# Patient Record
Sex: Male | Born: 1955 | Race: White | Hispanic: No | Marital: Married
Health system: Southern US, Community
[De-identification: ages and names within clinical notes are randomized; demographics above are authoritative.]

## PROBLEM LIST (undated history)

## (undated) DIAGNOSIS — R6520 Severe sepsis without septic shock: Secondary | ICD-10-CM

## (undated) DIAGNOSIS — J690 Pneumonitis due to inhalation of food and vomit: Secondary | ICD-10-CM

## (undated) DIAGNOSIS — J80 Acute respiratory distress syndrome: Secondary | ICD-10-CM

## (undated) DIAGNOSIS — J9621 Acute and chronic respiratory failure with hypoxia: Secondary | ICD-10-CM

## (undated) DIAGNOSIS — U071 COVID-19: Secondary | ICD-10-CM

## (undated) DIAGNOSIS — A419 Sepsis, unspecified organism: Secondary | ICD-10-CM

---

## 2000-05-02 ENCOUNTER — Encounter: Admission: RE | Admit: 2000-05-02 | Discharge: 2000-05-02 | Payer: Self-pay | Admitting: Family Medicine

## 2000-05-02 ENCOUNTER — Encounter: Payer: Self-pay | Admitting: Family Medicine

## 2000-05-23 ENCOUNTER — Encounter: Payer: Self-pay | Admitting: Family Medicine

## 2000-05-23 ENCOUNTER — Encounter: Admission: RE | Admit: 2000-05-23 | Discharge: 2000-05-23 | Payer: Self-pay | Admitting: Family Medicine

## 2011-09-04 ENCOUNTER — Emergency Department: Payer: Self-pay | Admitting: *Deleted

## 2019-12-13 ENCOUNTER — Other Ambulatory Visit (HOSPITAL_COMMUNITY): Payer: Medicare Other

## 2019-12-13 ENCOUNTER — Inpatient Hospital Stay
Admission: RE | Admit: 2019-12-13 | Discharge: 2020-01-03 | Disposition: A | Discharge status: Rehabilitation Facility | Payer: Medicare Other | Attending: Internal Medicine | Admitting: Internal Medicine

## 2019-12-13 DIAGNOSIS — J9621 Acute and chronic respiratory failure with hypoxia: Secondary | ICD-10-CM | POA: Diagnosis present

## 2019-12-13 DIAGNOSIS — J80 Acute respiratory distress syndrome: Secondary | ICD-10-CM | POA: Diagnosis present

## 2019-12-13 DIAGNOSIS — R6520 Severe sepsis without septic shock: Secondary | ICD-10-CM | POA: Diagnosis present

## 2019-12-13 DIAGNOSIS — Z931 Gastrostomy status: Secondary | ICD-10-CM

## 2019-12-13 DIAGNOSIS — J690 Pneumonitis due to inhalation of food and vomit: Secondary | ICD-10-CM | POA: Diagnosis present

## 2019-12-13 DIAGNOSIS — U071 COVID-19: Secondary | ICD-10-CM | POA: Diagnosis present

## 2019-12-13 DIAGNOSIS — A419 Sepsis, unspecified organism: Secondary | ICD-10-CM | POA: Diagnosis present

## 2019-12-13 DIAGNOSIS — J9690 Respiratory failure, unspecified, unspecified whether with hypoxia or hypercapnia: Secondary | ICD-10-CM

## 2019-12-13 HISTORY — DX: COVID-19: U07.1

## 2019-12-13 HISTORY — DX: Sepsis, unspecified organism: A41.9

## 2019-12-13 HISTORY — DX: Pneumonitis due to inhalation of food and vomit: J69.0

## 2019-12-13 HISTORY — DX: Acute and chronic respiratory failure with hypoxia: J96.21

## 2019-12-13 HISTORY — DX: Severe sepsis without septic shock: R65.20

## 2019-12-13 HISTORY — DX: Acute respiratory distress syndrome: J80

## 2019-12-14 ENCOUNTER — Other Ambulatory Visit (HOSPITAL_COMMUNITY): Payer: Medicare Other

## 2019-12-14 ENCOUNTER — Encounter: Payer: Self-pay | Admitting: Internal Medicine

## 2019-12-14 DIAGNOSIS — U071 COVID-19: Secondary | ICD-10-CM

## 2019-12-14 DIAGNOSIS — A419 Sepsis, unspecified organism: Secondary | ICD-10-CM | POA: Diagnosis not present

## 2019-12-14 DIAGNOSIS — J9621 Acute and chronic respiratory failure with hypoxia: Secondary | ICD-10-CM | POA: Diagnosis not present

## 2019-12-14 DIAGNOSIS — J80 Acute respiratory distress syndrome: Secondary | ICD-10-CM

## 2019-12-14 DIAGNOSIS — J690 Pneumonitis due to inhalation of food and vomit: Secondary | ICD-10-CM

## 2019-12-14 DIAGNOSIS — R6520 Severe sepsis without septic shock: Secondary | ICD-10-CM

## 2019-12-14 LAB — CBC WITH DIFFERENTIAL/PLATELET
Abs Immature Granulocytes: 0.13 10*3/uL — ABNORMAL HIGH (ref 0.00–0.07)
Basophils Absolute: 0 10*3/uL (ref 0.0–0.1)
Basophils Relative: 1 %
Eosinophils Absolute: 0.2 10*3/uL (ref 0.0–0.5)
Eosinophils Relative: 3 %
HCT: 26.3 % — ABNORMAL LOW (ref 39.0–52.0)
Hemoglobin: 8 g/dL — ABNORMAL LOW (ref 13.0–17.0)
Immature Granulocytes: 2 %
Lymphocytes Relative: 28 %
Lymphs Abs: 1.8 10*3/uL (ref 0.7–4.0)
MCH: 27.9 pg (ref 26.0–34.0)
MCHC: 30.4 g/dL (ref 30.0–36.0)
MCV: 91.6 fL (ref 80.0–100.0)
Monocytes Absolute: 0.7 10*3/uL (ref 0.1–1.0)
Monocytes Relative: 10 %
Neutro Abs: 3.7 10*3/uL (ref 1.7–7.7)
Neutrophils Relative %: 56 %
Platelets: 272 10*3/uL (ref 150–400)
RBC: 2.87 MIL/uL — ABNORMAL LOW (ref 4.22–5.81)
RDW: 15.6 % — ABNORMAL HIGH (ref 11.5–15.5)
WBC: 6.5 10*3/uL (ref 4.0–10.5)
nRBC: 0 % (ref 0.0–0.2)

## 2019-12-14 LAB — COMPREHENSIVE METABOLIC PANEL
ALT: 19 U/L (ref 0–44)
AST: 18 U/L (ref 15–41)
Albumin: 2.2 g/dL — ABNORMAL LOW (ref 3.5–5.0)
Alkaline Phosphatase: 61 U/L (ref 38–126)
Anion gap: 12 (ref 5–15)
BUN: 32 mg/dL — ABNORMAL HIGH (ref 8–23)
CO2: 34 mmol/L — ABNORMAL HIGH (ref 22–32)
Calcium: 9 mg/dL (ref 8.9–10.3)
Chloride: 99 mmol/L (ref 98–111)
Creatinine, Ser: 0.57 mg/dL — ABNORMAL LOW (ref 0.61–1.24)
GFR calc Af Amer: >60 mL/min (ref >60)
GFR calc non Af Amer: >60 mL/min (ref >60)
Glucose, Bld: 148 mg/dL — ABNORMAL HIGH (ref 70–99)
Potassium: 3.8 mmol/L (ref 3.5–5.1)
Sodium: 145 mmol/L (ref 135–145)
Total Bilirubin: 0.6 mg/dL (ref 0.3–1.2)
Total Protein: 6.3 g/dL — ABNORMAL LOW (ref 6.5–8.1)

## 2019-12-14 LAB — BLOOD GAS, ARTERIAL
Acid-Base Excess: 14.4 mmol/L — ABNORMAL HIGH (ref 0.0–2.0)
Bicarbonate: 39.7 mmol/L — ABNORMAL HIGH (ref 20.0–28.0)
FIO2: 40
O2 Saturation: 98.7 %
Patient temperature: 36.2
pCO2 arterial: 60.3 mmHg — ABNORMAL HIGH (ref 32.0–48.0)
pH, Arterial: 7.429 (ref 7.350–7.450)
pO2, Arterial: 121 mmHg — ABNORMAL HIGH (ref 83.0–108.0)

## 2019-12-14 LAB — MAGNESIUM: Magnesium: 2.1 mg/dL (ref 1.7–2.4)

## 2019-12-14 LAB — PROTIME-INR
INR: 1.2 (ref 0.8–1.2)
Prothrombin Time: 14.7 seconds (ref 11.4–15.2)

## 2019-12-14 LAB — PHOSPHORUS: Phosphorus: 4 mg/dL (ref 2.5–4.6)

## 2019-12-14 MED ORDER — CHLORHEXIDINE GLUCONATE 0.12 % MT SOLN
5.00 | OROMUCOSAL | Status: DC
Start: 2019-12-13 — End: 2019-12-14

## 2019-12-14 MED ORDER — OXYCODONE HCL 5 MG PO TABS
10.00 | ORAL_TABLET | ORAL | Status: DC
Start: ? — End: 2019-12-14

## 2019-12-14 MED ORDER — SERTRALINE HCL 50 MG PO TABS
50.00 | ORAL_TABLET | ORAL | Status: DC
Start: 2019-12-14 — End: 2019-12-14

## 2019-12-14 MED ORDER — CARVEDILOL 12.5 MG PO TABS
12.50 | ORAL_TABLET | ORAL | Status: DC
Start: 2019-12-13 — End: 2019-12-14

## 2019-12-14 MED ORDER — POTASSIUM & SODIUM PHOSPHATES 280-160-250 MG PO PACK
1.00 | PACK | ORAL | Status: DC
Start: 2019-12-13 — End: 2019-12-14

## 2019-12-14 MED ORDER — CARBOXYMETHYLCELLULOSE SODIUM 0.25 % OP SOLN
1.00 | OPHTHALMIC | Status: DC
Start: 2019-12-14 — End: 2019-12-14

## 2019-12-14 MED ORDER — QUETIAPINE FUMARATE 100 MG PO TABS
100.00 | ORAL_TABLET | ORAL | Status: DC
Start: 2019-12-13 — End: 2019-12-14

## 2019-12-14 MED ORDER — SODIUM CHLORIDE FLUSH 0.9 % IV SOLN
10.00 | INTRAVENOUS | Status: DC
Start: 2019-12-13 — End: 2019-12-14

## 2019-12-14 MED ORDER — POLYETHYLENE GLYCOL 3350 17 GM/SCOOP PO POWD
17.00 | ORAL | Status: DC
Start: 2019-12-14 — End: 2019-12-14

## 2019-12-14 MED ORDER — APIXABAN 2.5 MG PO TABS
2.50 | ORAL_TABLET | ORAL | Status: DC
Start: 2019-12-13 — End: 2019-12-14

## 2019-12-14 MED ORDER — MELATONIN 3 MG PO TABS
3.00 | ORAL_TABLET | ORAL | Status: DC
Start: 2019-12-13 — End: 2019-12-14

## 2019-12-14 MED ORDER — SENNOSIDES 8.6 MG PO TABS
1.00 | ORAL_TABLET | ORAL | Status: DC
Start: ? — End: 2019-12-14

## 2019-12-14 MED ORDER — INSULIN NPH (HUMAN) (ISOPHANE) 100 UNIT/ML ~~LOC~~ SUSP
12.00 | SUBCUTANEOUS | Status: DC
Start: 2019-12-13 — End: 2019-12-14

## 2019-12-14 MED ORDER — ACETAMINOPHEN 325 MG PO TABS
650.00 | ORAL_TABLET | ORAL | Status: DC
Start: ? — End: 2019-12-14

## 2019-12-14 MED ORDER — DEXTROSE 50 % IV SOLN
12.50 | INTRAVENOUS | Status: DC
Start: ? — End: 2019-12-14

## 2019-12-14 MED ORDER — INSULIN LISPRO 100 UNIT/ML ~~LOC~~ SOLN
0.00 | SUBCUTANEOUS | Status: DC
Start: 2019-12-13 — End: 2019-12-14

## 2019-12-14 MED ORDER — ONDANSETRON HCL 4 MG/2ML IJ SOLN
4.00 | INTRAMUSCULAR | Status: DC
Start: ? — End: 2019-12-14

## 2019-12-14 MED ORDER — OXYCODONE HCL 5 MG PO TABS
15.00 | ORAL_TABLET | ORAL | Status: DC
Start: 2019-12-13 — End: 2019-12-14

## 2019-12-14 MED ORDER — ASPIRIN 81 MG PO CHEW
81.00 | CHEWABLE_TABLET | ORAL | Status: DC
Start: 2019-12-14 — End: 2019-12-14

## 2019-12-14 MED ORDER — ALBUTEROL SULFATE (2.5 MG/3ML) 0.083% IN NEBU
2.50 | INHALATION_SOLUTION | RESPIRATORY_TRACT | Status: DC
Start: 2019-12-13 — End: 2019-12-14

## 2019-12-14 MED ORDER — CEFEPIME HCL 2 G IJ SOLR
2.00 | INTRAMUSCULAR | Status: DC
Start: 2019-12-13 — End: 2019-12-14

## 2019-12-14 MED ORDER — AMLODIPINE BESYLATE 10 MG PO TABS
10.00 | ORAL_TABLET | ORAL | Status: DC
Start: 2019-12-14 — End: 2019-12-14

## 2019-12-14 MED ORDER — WH PETROL-MINERAL OIL-LANOLIN 0.1-0.1 % OP OINT
1.00 | TOPICAL_OINTMENT | OPHTHALMIC | Status: DC
Start: ? — End: 2019-12-14

## 2019-12-14 MED ORDER — FAMOTIDINE 20 MG PO TABS
20.00 | ORAL_TABLET | ORAL | Status: DC
Start: 2019-12-13 — End: 2019-12-14

## 2019-12-14 NOTE — Consult Note (Signed)
Pulmonary Critical Care Medicine [facility] [place]  PULMONARY SERVICE  Date of Service: 12/14/2019  PULMONARY CRITICAL CARE Mason Castillo  YBW:[id number]  DOB: 1956-11-10   DOA: 12/13/2019  Referring Physician: Carron Curie, MD  HPI: Mason Castillo is a 63 y.o. male seen for follow up of Acute on Chronic Respiratory Failure.  Patient is multiple medical problems including GERD basal carcinoma hypertension who presented to the hospital with a diagnosis of COVID-19.  Patient was apparently noted to be significantly hypoxic ended up having to be intubated and was started on Decadron as well as remdesivir.  Patient was extubated however failed trial of extubation and had to be reintubated on December 3 because of worsening of mental status.  Patient at that point was felt to have healthcare associated pneumonia versus aspiration.  Started on cefepime and vancomycin and ultimately was titrated down to Unasyn.  The patient was once again attempted at extubation however failed subsequently ended up having to have a tracheostomy done.  Transferred to our facility for further management and weaning  Review of Systems:  ROS performed and is unremarkable other than noted above.  Past Medical History Past Medical History:  Diagnosis Date  . Acid reflux  . Basal cell carcinoma  . Hypertension 2015  . Skin cancer 2017  had skin cancer on face, cannot remember the type   Past Surgical History Past Surgical History:  Procedure Laterality Date  . HERNIA REPAIR  . 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 SKIN BIOPSY  . TONSILLECTOMY   Family History Family History  Problem Relation Age of Onset  . Anesthesia problems Neg Hx  . Bleeding Disorder  Neg Hx  . Melanoma Neg Hx  . Basal cell carcinoma Neg Hx  . Squamous cell carcinoma Neg Hx   Social History Social History   Socioeconomic History  . Marital status: Married  Spouse name: Not on file  . Number of children: Not on file  . Years of education: Not on file  . Highest education level: Not on file  Occupational History  . Not on file  Social Needs  . Financial resource strain: Not on file  . Food insecurity  Worry: Not on file  Inability: Not on file  . Transportation needs  Medical: Not on file  Non-medical: Not on file  Tobacco Use  . Smoking status: Never Smoker  . Smokeless tobacco: Never Used  Substance and Sexual Activity  . Alcohol use: Yes  Comment: occasionally  . Drug use: No    Medications: Reviewed on Rounds  Physical Exam:  Vitals: Temperature 97.0 pulse 86 respiratory 23 blood pressure 141/78 saturations 100%  Ventilator Settings mode ventilation assist control FiO2 40% tidal volume 450 PEEP 5  . General: Comfortable at this time . Eyes: Grossly normal lids, irises & conjunctiva . ENT: grossly tongue is normal . Neck: no obvious mass . Cardiovascular: S1-S2 normal no gallop or rub . Respiratory: No rhonchi no rales are noted at this time . Abdomen: Soft and nontender . Skin: no rash seen on limited exam . Musculoskeletal: not rigid . Psychiatric:unable to assess . Neurologic: no seizure no involuntary movements         Labs on Admission:  Basic Metabolic Panel: Recent Labs  Lab 12/14/19 0638  NA 145  K  3.8  CL 99  CO2 34*  GLUCOSE 148*  BUN 32*  CREATININE 0.57*  CALCIUM 9.0  MG 2.1  PHOS 4.0    Recent Labs  Lab 12/14/19 0228  PHART 7.429  PCO2ART 60.3*  PO2ART 121*  HCO3 39.7*  O2SAT 98.7    Liver Function Tests: Recent Labs  Lab 12/14/19 0638  AST 18  ALT 19  ALKPHOS 61  BILITOT 0.6  PROT 6.3*  ALBUMIN 2.2*   No results for input(s): LIPASE, AMYLASE in the last 168 hours. No results for input(s):  AMMONIA in the last 168 hours.  CBC: Recent Labs  Lab 12/14/19 0638  WBC 6.5  NEUTROABS 3.7  HGB 8.0*  HCT 26.3*  MCV 91.6  PLT 272    Cardiac Enzymes: No results for input(s): CKTOTAL, CKMB, CKMBINDEX, TROPONINI in the last 168 hours.  BNP (last 3 results) No results for input(s): BNP in the last 8760 hours.  ProBNP (last 3 results) No results for input(s): PROBNP in the last 8760 hours.   Radiological Exams on Admission: DG Abd 1 View  Result Date: 12/13/2019 CLINICAL DATA:  Peg tube placement EXAM: ABDOMEN - 1 VIEW COMPARISON:  None. FINDINGS: Injection of contrast through the pre-existing PEG tube opacifies the stomach. There is no definite pneumoperitoneum. Oral contrast is noted throughout the colon. IMPRESSION: Injection of contrast through the pre-existing PEG tube opacifies the stomach. Electronically Signed   By: Constance Holster M.D.   On: 12/13/2019 19:28   DG Chest Port 1 View  Result Date: 12/14/2019 CLINICAL DATA:  Tracheostomy tube EXAM: PORTABLE CHEST 1 VIEW COMPARISON:  07/20/2015 FINDINGS: Tracheostomy tube is seated with cuff mildly distending the trachea. There is a right upper extremity PICC with tip at the upper right atrium. Coarse interstitial opacities which may be related to history of COVID. Lung volumes are low. No definite edema and no visible pneumothorax or pleural effusion. Cardiomegaly. IMPRESSION: 1. Low volume chest with nonspecific interstitial coarsening. 2. Hardware as described. Electronically Signed   By: Monte Fantasia M.D.   On: 12/14/2019 07:01    Assessment/Plan Active Problems:   Acute on chronic respiratory failure with hypoxia (HCC)   COVID-19 virus infection   Aspiration pneumonia due to gastric secretions (HCC)   Severe sepsis (HCC)   Acute respiratory distress syndrome (ARDS) due to 2019 novel coronavirus ([place])   1. Acute on chronic respiratory failure with hypoxia patient right now is on full support on assist control  mode has been on 40% FiO2 tidal volume of 450 PEEP is right now at 5 respiratory therapy will check the RSB I mechanics last chest x-ray showing nonspecific interstitial changing likely related to underlying history of Covid pneumonia. 2. COVID-19 virus infection now is in resolution phase we will continue with supportive care has completed remdesivir Decadron therapy 3. Aspiration pneumonia patient was treated with antibiotics cefepime and also supposed to continue until the end of the month December 15, 2019 for ventilator associated pneumonia. 4. Severe sepsis with shock treated hemodynamics are stable we will continue with supportive care 5. ARDS secondary to COVID-19 plan is to continue with supportive care and monitor radiologically clinically is improved  I have personally seen and evaluated the patient, evaluated laboratory and imaging results, formulated the assessment and plan and placed orders. The Patient requires high complexity decision making for assessment and support.  Case was discussed on Rounds with the Respiratory Therapy Staff Time Spent 79minutes  Allyne Gee, MD [facility] Pulmonary Critical  Care Medicine Sleep Medicine

## 2019-12-15 DIAGNOSIS — U071 COVID-19: Secondary | ICD-10-CM | POA: Diagnosis not present

## 2019-12-15 DIAGNOSIS — J690 Pneumonitis due to inhalation of food and vomit: Secondary | ICD-10-CM | POA: Diagnosis not present

## 2019-12-15 DIAGNOSIS — J9621 Acute and chronic respiratory failure with hypoxia: Secondary | ICD-10-CM | POA: Diagnosis not present

## 2019-12-15 DIAGNOSIS — A419 Sepsis, unspecified organism: Secondary | ICD-10-CM | POA: Diagnosis not present

## 2019-12-15 NOTE — Progress Notes (Addendum)
Pulmonary Critical Care Medicine [place]   PULMONARY CRITICAL CARE SERVICE  PROGRESS NOTE  Date of Service: 12/15/2019  Mason Castillo  KGM:[id number]  DOB: 1956-09-19   DOA: 12/13/2019  Referring Physician: Merton Border, MD  HPI: Mason Castillo is a 63 y.o. male seen for follow up of Acute on Chronic Respiratory Failure.  Patient is on pressure support mode is actually tolerating it quite well right now has been on 28% FiO2.  Respiratory therapy reports to me that they've been having more difficulty with placing him back on the ventilator at nighttime and he may actually do better on pressure support mode 24 hours.  Medications: Reviewed on Rounds  Physical Exam:  Vitals: Temperature 97.0 pulse 79 respiratory rate 22 blood pressure 121/67 saturations 98%  Ventilator Settings mode ventilation pressure support FiO2 20% tidal volume 716 pressure support 12 PEEP 5  . General: Comfortable at this time . Eyes: Grossly normal lids, irises & conjunctiva . ENT: grossly tongue is normal . Neck: no obvious mass . Cardiovascular: S1 S2 normal no gallop . Respiratory: No rhonchi coarse breath sounds are noted at this time . Abdomen: soft . Skin: no rash seen on limited exam . Musculoskeletal: not rigid . Psychiatric:unable to assess . Neurologic: no seizure no involuntary movements         Lab Data:   Basic Metabolic Panel: Recent Labs  Lab 12/14/19 0638  NA 145  K 3.8  CL 99  CO2 34*  GLUCOSE 148*  BUN 32*  CREATININE 0.57*  CALCIUM 9.0  MG 2.1  PHOS 4.0    ABG: Recent Labs  Lab 12/14/19 0228  PHART 7.429  PCO2ART 60.3*  PO2ART 121*  HCO3 39.7*  O2SAT 98.7    Liver Function Tests: Recent Labs  Lab 12/14/19 0638  AST 18  ALT 19  ALKPHOS 61  BILITOT 0.6  PROT 6.3*  ALBUMIN 2.2*   No results for input(s): LIPASE, AMYLASE in the last 168 hours. No results for input(s): AMMONIA in the last 168 hours.  CBC: Recent Labs  Lab  12/14/19 0638  WBC 6.5  NEUTROABS 3.7  HGB 8.0*  HCT 26.3*  MCV 91.6  PLT 272    Cardiac Enzymes: No results for input(s): CKTOTAL, CKMB, CKMBINDEX, TROPONINI in the last 168 hours.  BNP (last 3 results) No results for input(s): BNP in the last 8760 hours.  ProBNP (last 3 results) No results for input(s): PROBNP in the last 8760 hours.  Radiological Exams: DG Abd 1 View  Result Date: 12/13/2019 CLINICAL DATA:  Peg tube placement EXAM: ABDOMEN - 1 VIEW COMPARISON:  None. FINDINGS: Injection of contrast through the pre-existing PEG tube opacifies the stomach. There is no definite pneumoperitoneum. Oral contrast is noted throughout the colon. IMPRESSION: Injection of contrast through the pre-existing PEG tube opacifies the stomach. Electronically Signed   By: Constance Holster M.D.   On: 12/13/2019 19:28   DG Chest Port 1 View  Result Date: 12/14/2019 CLINICAL DATA:  Tracheostomy tube EXAM: PORTABLE CHEST 1 VIEW COMPARISON:  07/20/2015 FINDINGS: Tracheostomy tube is seated with cuff mildly distending the trachea. There is a right upper extremity PICC with tip at the upper right atrium. Coarse interstitial opacities which may be related to history of COVID. Lung volumes are low. No definite edema and no visible pneumothorax or pleural effusion. Cardiomegaly. IMPRESSION: 1. Low volume chest with nonspecific interstitial coarsening. 2. Hardware as described. Electronically Signed   By: Neva Seat.D.  On: 12/14/2019 07:01    Assessment/Plan Active Problems:   Acute on chronic respiratory failure with hypoxia (HCC)   COVID-19 virus infection   Aspiration pneumonia due to gastric secretions (HCC)   Severe sepsis (HCC)   Acute respiratory distress syndrome (ARDS) due to 2019 novel coronavirus (HCC)   1. Acute on chronic respiratory failure hypoxia plan is to continue to wean I would advance on pressure support as tolerated for now if patient does well with this then I think we  can advance to T collar weaning. 2. COVID-19 virus infection treated clinically to improve 3. Aspiration pneumonia treated we'll continue with supportive care 4. Severe sepsis hemodynamics are stable we'll continue to monitor 5. ARDS treated improved we'll continue with present management   I have personally seen and evaluated the patient, evaluated laboratory and imaging results, formulated the assessment and plan and placed orders.  Time 35 minutes discussion with primary care team The Patient requires high complexity decision making for assessment and support.  Rounds were done with the Respiratory Therapy Director and Staff therapists and discussed with nursing staff also.  Yevonne Pax, MD [facility] Pulmonary Critical Care Medicine Sleep Medicine

## 2019-12-16 DIAGNOSIS — U071 COVID-19: Secondary | ICD-10-CM | POA: Diagnosis not present

## 2019-12-16 DIAGNOSIS — J690 Pneumonitis due to inhalation of food and vomit: Secondary | ICD-10-CM | POA: Diagnosis not present

## 2019-12-16 DIAGNOSIS — J9621 Acute and chronic respiratory failure with hypoxia: Secondary | ICD-10-CM | POA: Diagnosis not present

## 2019-12-16 DIAGNOSIS — A419 Sepsis, unspecified organism: Secondary | ICD-10-CM | POA: Diagnosis not present

## 2019-12-16 NOTE — Progress Notes (Addendum)
Pulmonary Critical Care Medicine [place]   PULMONARY CRITICAL CARE SERVICE  PROGRESS NOTE  Date of Service: 12/16/2019  Mason Castillo  SFK:[id number]  DOB: 10/17/1956   DOA: 12/13/2019  Referring Physician: Merton Border, MD  HPI: Mason Castillo is a 63 y.o. male seen for follow up of Acute on Chronic Respiratory Failure.  Patient currently is on pressure support mode comfortable right now without distress has been on 28% FiO2 good saturations are noted  Medications: Reviewed on Rounds  Physical Exam:  Vitals: Temperature 97.9 pulse 88 respiratory rate 25 blood pressure is 142/77 saturations 97%  Ventilator Settings mode of ventilation pressure support FiO2 20% tidal volume 600 pressure 12 PEEP 5  . General: Comfortable at this time . Eyes: Grossly normal lids, irises & conjunctiva . ENT: grossly tongue is normal . Neck: no obvious mass . Cardiovascular: S1 S2 normal no gallop . Respiratory: No rhonchi coarse breath sounds are noted at this time . Abdomen: soft . Skin: no rash seen on limited exam . Musculoskeletal: not rigid . Psychiatric:unable to assess . Neurologic: no seizure no involuntary movements         Lab Data:   Basic Metabolic Panel: Recent Labs  Lab 12/14/19 0638  NA 145  K 3.8  CL 99  CO2 34*  GLUCOSE 148*  BUN 32*  CREATININE 0.57*  CALCIUM 9.0  MG 2.1  PHOS 4.0    ABG: Recent Labs  Lab 12/14/19 0228  PHART 7.429  PCO2ART 60.3*  PO2ART 121*  HCO3 39.7*  O2SAT 98.7    Liver Function Tests: Recent Labs  Lab 12/14/19 0638  AST 18  ALT 19  ALKPHOS 61  BILITOT 0.6  PROT 6.3*  ALBUMIN 2.2*   No results for input(s): LIPASE, AMYLASE in the last 168 hours. No results for input(s): AMMONIA in the last 168 hours.  CBC: Recent Labs  Lab 12/14/19 0638  WBC 6.5  NEUTROABS 3.7  HGB 8.0*  HCT 26.3*  MCV 91.6  PLT 272    Cardiac Enzymes: No results for input(s): CKTOTAL, CKMB, CKMBINDEX, TROPONINI in  the last 168 hours.  BNP (last 3 results) No results for input(s): BNP in the last 8760 hours.  ProBNP (last 3 results) No results for input(s): PROBNP in the last 8760 hours.  Radiological Exams: No results found.  Assessment/Plan Active Problems:   Acute on chronic respiratory failure with hypoxia (HCC)   COVID-19 virus infection   Aspiration pneumonia due to gastric secretions (HCC)   Severe sepsis (HCC)   Acute respiratory distress syndrome (ARDS) due to 2019 novel coronavirus ([place])   1. Acute on chronic respiratory failure with hypoxia patient is doing well with pressure support we will continue to wean as tolerated. 2. COVID-19 virus infection patient is in resolution phase 3. ARDS secondary to COVID-19 residual deficits noted we will continue to monitor 4. Severe sepsis resolved hemodynamics are stable 5. Aspiration pneumonia treated we will continue to follow   I have personally seen and evaluated the patient, evaluated laboratory and imaging results, formulated the assessment and plan and placed orders. The Patient requires high complexity decision making for assessment and support.  Rounds were done with the Respiratory Therapy Director and Staff therapists and discussed with nursing staff also.  Time 35 minutes  Allyne Gee, MD [facility] Pulmonary Critical Care Medicine Sleep Medicine

## 2019-12-17 DIAGNOSIS — J690 Pneumonitis due to inhalation of food and vomit: Secondary | ICD-10-CM | POA: Diagnosis not present

## 2019-12-17 DIAGNOSIS — A419 Sepsis, unspecified organism: Secondary | ICD-10-CM | POA: Diagnosis not present

## 2019-12-17 DIAGNOSIS — J9621 Acute and chronic respiratory failure with hypoxia: Secondary | ICD-10-CM | POA: Diagnosis not present

## 2019-12-17 DIAGNOSIS — U071 COVID-19: Secondary | ICD-10-CM | POA: Diagnosis not present

## 2019-12-17 NOTE — Progress Notes (Signed)
Pulmonary Critical Care Medicine [facility] [place]   PULMONARY CRITICAL CARE SERVICE  PROGRESS NOTE  Date of Service: 12/17/2019  Mason Castillo  [id number]:[id number]  DOB: 1956-05-06   DOA: 12/13/2019  Referring Physician: Carron Curie, MD  HPI: Mason Castillo is a 64 y.o. male seen for follow up of Acute on Chronic Respiratory Failure.  Patient has been on the ventilator on pressure support mode currently on 12/5 and has been requiring 28% FiO2.  Reportedly has been tolerating this quite well and we should be able to move to spontaneous off the ventilator trials  Medications: Reviewed on Rounds  Physical Exam:  Vitals: Temperature is 97.4 pulse 65 respiratory rate 16 blood pressure is 125/68 saturations 99%  Ventilator Settings mode ventilation pressure support FiO2 28% pressure support 12 PEEP 5 tidal volume is 771  . General: Comfortable at this time . Eyes: Grossly normal lids, irises & conjunctiva . ENT: grossly tongue is normal . Neck: no obvious mass . Cardiovascular: S1 S2 normal no gallop . Respiratory: Coarse breath sounds few scattered rhonchi are noted at this time . Abdomen: soft . Skin: no rash seen on limited exam . Musculoskeletal: not rigid . Psychiatric:unable to assess . Neurologic: no seizure no involuntary movements         Lab Data:   Basic Metabolic Panel: Recent Labs  Lab 12/14/19 0638  NA 145  K 3.8  CL 99  CO2 34*  GLUCOSE 148*  BUN 32*  CREATININE 0.57*  CALCIUM 9.0  MG 2.1  PHOS 4.0    ABG: Recent Labs  Lab 12/14/19 0228  PHART 7.429  PCO2ART 60.3*  PO2ART 121*  HCO3 39.7*  O2SAT 98.7    Liver Function Tests: Recent Labs  Lab 12/14/19 0638  AST 18  ALT 19  ALKPHOS 61  BILITOT 0.6  PROT 6.3*  ALBUMIN 2.2*   No results for input(s): LIPASE, AMYLASE in the last 168 hours. No results for input(s): AMMONIA in the last 168 hours.  CBC: Recent Labs  Lab 12/14/19 0638  WBC 6.5  NEUTROABS 3.7  HGB 8.0*   HCT 26.3*  MCV 91.6  PLT 272    Cardiac Enzymes: No results for input(s): CKTOTAL, CKMB, CKMBINDEX, TROPONINI in the last 168 hours.  BNP (last 3 results) No results for input(s): BNP in the last 8760 hours.  ProBNP (last 3 results) No results for input(s): PROBNP in the last 8760 hours.  Radiological Exams: No results found.  Assessment/Plan Active Problems:   Acute on chronic respiratory failure with hypoxia (HCC)   COVID-19 virus infection   Aspiration pneumonia due to gastric secretions (HCC)   Severe sepsis (HCC)   Acute respiratory distress syndrome (ARDS) due to 2019 novel coronavirus (HCC)   1. Acute on chronic respiratory failure with hypoxia patient will be moved over to an NAG device for spontaneous breathing trials.  Patient is currently on 28% FiO2 has excellent tidal volumes noted on the ventilator.  We will start at 2 hours and advance as tolerated 2. COVID-19 virus infection treated improving clinically although slowly 3. Aspiration pneumonia risk we will continue to follow chest x-ray closely 4. Severe sepsis hemodynamics are stable 5. Acute respiratory distress clinically resolved with residual   I have personally seen and evaluated the patient, evaluated laboratory and imaging results, formulated the assessment and plan and placed orders. The Patient requires high complexity decision making for assessment and support.  Rounds were done with the Respiratory Therapy Director and  Staff therapists and discussed with nursing staff also.  Allyne Gee, MD [facility] Pulmonary Critical Care Medicine Sleep Medicine

## 2019-12-18 DIAGNOSIS — J690 Pneumonitis due to inhalation of food and vomit: Secondary | ICD-10-CM | POA: Diagnosis not present

## 2019-12-18 DIAGNOSIS — J9621 Acute and chronic respiratory failure with hypoxia: Secondary | ICD-10-CM | POA: Diagnosis not present

## 2019-12-18 DIAGNOSIS — A419 Sepsis, unspecified organism: Secondary | ICD-10-CM | POA: Diagnosis not present

## 2019-12-18 DIAGNOSIS — U071 COVID-19: Secondary | ICD-10-CM | POA: Diagnosis not present

## 2019-12-18 LAB — BASIC METABOLIC PANEL
Anion gap: 9 (ref 5–15)
BUN: 27 mg/dL — ABNORMAL HIGH (ref 8–23)
CO2: 32 mmol/L (ref 22–32)
Calcium: 8.7 mg/dL — ABNORMAL LOW (ref 8.9–10.3)
Chloride: 101 mmol/L (ref 98–111)
Creatinine, Ser: 0.6 mg/dL — ABNORMAL LOW (ref 0.61–1.24)
GFR calc Af Amer: >60 mL/min (ref >60)
GFR calc non Af Amer: >60 mL/min (ref >60)
Glucose, Bld: 151 mg/dL — ABNORMAL HIGH (ref 70–99)
Potassium: 4.4 mmol/L (ref 3.5–5.1)
Sodium: 142 mmol/L (ref 135–145)

## 2019-12-18 LAB — CBC
HCT: 28.9 % — ABNORMAL LOW (ref 39.0–52.0)
Hemoglobin: 9.3 g/dL — ABNORMAL LOW (ref 13.0–17.0)
MCH: 28.3 pg (ref 26.0–34.0)
MCHC: 32.2 g/dL (ref 30.0–36.0)
MCV: 87.8 fL (ref 80.0–100.0)
Platelets: 234 10*3/uL (ref 150–400)
RBC: 3.29 MIL/uL — ABNORMAL LOW (ref 4.22–5.81)
RDW: 15.9 % — ABNORMAL HIGH (ref 11.5–15.5)
WBC: 6.6 10*3/uL (ref 4.0–10.5)
nRBC: 0 % (ref 0.0–0.2)

## 2019-12-18 NOTE — Progress Notes (Addendum)
Pulmonary Critical Care Medicine [facility] [place]   PULMONARY CRITICAL CARE SERVICE  PROGRESS NOTE  Date of Service: 12/18/2019  Mason Castillo  OIZ:[id number]  DOB: [birth year]   DOA: 12/13/2019  Referring Physician: Carron Curie, MD  HPI: Mason Castillo is a 64 y.o. male seen for follow up of Acute on Chronic Respiratory Failure.  Patient currently is on pressure support mode was able to do 4 hours weaning off the ventilator with  NAG device.  Patient actually tolerated fairly well.  Now is back on the pressure support and is on 28% FiO2  Medications: Reviewed on Rounds  Physical Exam:  Vitals: Temperature 96.9 pulse 50 respiratory rate 13 blood pressure is 147/76 saturations 100%  Ventilator Settings mode ventilation pressure support FiO2 28% tidal volume 571 pressure poor 12 PEEP 5  . General: Comfortable at this time . Eyes: Grossly normal lids, irises & conjunctiva . ENT: grossly tongue is normal . Neck: no obvious mass . Cardiovascular: S1 S2 normal no gallop . Respiratory: No rhonchi no rales are noted at this time . Abdomen: soft . Skin: no rash seen on limited exam . Musculoskeletal: not rigid . Psychiatric:unable to assess . Neurologic: no seizure no involuntary movements         Lab Data:   Basic Metabolic Panel: Recent Labs  Lab 12/14/19 0638 12/18/19 0405  NA 145 142  K 3.8 4.4  CL 99 101  CO2 34* 32  GLUCOSE 148* 151*  BUN 32* 27*  CREATININE 0.57* 0.60*  CALCIUM 9.0 8.7*  MG 2.1  --   PHOS 4.0  --     ABG: Recent Labs  Lab 12/14/19 0228  PHART 7.429  PCO2ART 60.3*  PO2ART 121*  HCO3 39.7*  O2SAT 98.7    Liver Function Tests: Recent Labs  Lab 12/14/19 0638  AST 18  ALT 19  ALKPHOS 61  BILITOT 0.6  PROT 6.3*  ALBUMIN 2.2*   No results for input(s): LIPASE, AMYLASE in the last 168 hours. No results for input(s): AMMONIA in the last 168 hours.  CBC: Recent Labs  Lab 12/14/19 0638 12/18/19 0405  WBC 6.5 6.6   NEUTROABS 3.7  --   HGB 8.0* 9.3*  HCT 26.3* 28.9*  MCV 91.6 87.8  PLT 272 234    Cardiac Enzymes: No results for input(s): CKTOTAL, CKMB, CKMBINDEX, TROPONINI in the last 168 hours.  BNP (last 3 results) No results for input(s): BNP in the last 8760 hours.  ProBNP (last 3 results) No results for input(s): PROBNP in the last 8760 hours.  Radiological Exams: No results found.  Assessment/Plan Active Problems:   Acute on chronic respiratory failure with hypoxia (HCC)   COVID-19 virus infection   Aspiration pneumonia due to gastric secretions (HCC)   Severe sepsis (HCC)   Acute respiratory distress syndrome (ARDS) due to 2019 novel coronavirus (HCC)   1. Acute on chronic respiratory failure with hypoxia we will continue with full support on pressure support at this time patient right now is resting mode patient will be resumed on NAG device again did well this morning with 4 hours. 2. COVID-19 virus infection in recovery phase plan is to continue with supportive care 3. Aspiration pneumonia treated still at risk for aspiration we will continue to follow 4. Severe sepsis resolved hemodynamics are stable 5. ARDS treated patient has had slow improvement last chest x-ray still showed significant interstitial disease as a remnant of the COVID-19   I have personally seen  and evaluated the patient, evaluated laboratory and imaging results, formulated the assessment and plan and placed orders. The Patient requires high complexity decision making with multiple systems involvement.  Rounds were done with the Respiratory Therapy Director and Staff therapists and discussed with nursing staff also.  Time 35 minutes  Allyne Gee, MD [facility] Pulmonary Critical Care Medicine Sleep Medicine

## 2019-12-19 DIAGNOSIS — A419 Sepsis, unspecified organism: Secondary | ICD-10-CM | POA: Diagnosis not present

## 2019-12-19 DIAGNOSIS — U071 COVID-19: Secondary | ICD-10-CM | POA: Diagnosis not present

## 2019-12-19 DIAGNOSIS — J9621 Acute and chronic respiratory failure with hypoxia: Secondary | ICD-10-CM | POA: Diagnosis not present

## 2019-12-19 DIAGNOSIS — J690 Pneumonitis due to inhalation of food and vomit: Secondary | ICD-10-CM | POA: Diagnosis not present

## 2019-12-19 NOTE — Progress Notes (Signed)
Pulmonary Critical Care Medicine [facility] [place]   PULMONARY CRITICAL CARE SERVICE  PROGRESS NOTE  Date of Service: 12/19/2019  Mason Castillo  [id number]:[id number]  DOB: 1956-04-09   DOA: 12/13/2019  Referring Physician: Carron Curie, MD  HPI: Mason Castillo is a 64 y.o. male seen for follow up of Acute on Chronic Respiratory Failure.  Patient is on the NAG device right now on 1 L oxygen the goal is for 8 hours  Medications: Reviewed on Rounds  Physical Exam:  Vitals: Temperature 97.2 pulse 67 respiratory 18 blood pressure is 109/51 saturations 99%  Ventilator Settings off the ventilator on 1 L oxygen  . General: Comfortable at this time . Eyes: Grossly normal lids, irises & conjunctiva . ENT: grossly tongue is normal . Neck: no obvious mass . Cardiovascular: S1 S2 normal no gallop . Respiratory: Scattered rhonchi expansion is equal . Abdomen: soft . Skin: no rash seen on limited exam . Musculoskeletal: not rigid . Psychiatric:unable to assess . Neurologic: no seizure no involuntary movements         Lab Data:   Basic Metabolic Panel: Recent Labs  Lab 12/14/19 0638 12/18/19 0405  NA 145 142  K 3.8 4.4  CL 99 101  CO2 34* 32  GLUCOSE 148* 151*  BUN 32* 27*  CREATININE 0.57* 0.60*  CALCIUM 9.0 8.7*  MG 2.1  --   PHOS 4.0  --     ABG: Recent Labs  Lab 12/14/19 0228  PHART 7.429  PCO2ART 60.3*  PO2ART 121*  HCO3 39.7*  O2SAT 98.7    Liver Function Tests: Recent Labs  Lab 12/14/19 0638  AST 18  ALT 19  ALKPHOS 61  BILITOT 0.6  PROT 6.3*  ALBUMIN 2.2*   No results for input(s): LIPASE, AMYLASE in the last 168 hours. No results for input(s): AMMONIA in the last 168 hours.  CBC: Recent Labs  Lab 12/14/19 0638 12/18/19 0405  WBC 6.5 6.6  NEUTROABS 3.7  --   HGB 8.0* 9.3*  HCT 26.3* 28.9*  MCV 91.6 87.8  PLT 272 234    Cardiac Enzymes: No results for input(s): CKTOTAL, CKMB, CKMBINDEX, TROPONINI in the last 168 hours.  BNP  (last 3 results) No results for input(s): BNP in the last 8760 hours.  ProBNP (last 3 results) No results for input(s): PROBNP in the last 8760 hours.  Radiological Exams: No results found.  Assessment/Plan Active Problems:   Acute on chronic respiratory failure with hypoxia (HCC)   COVID-19 virus infection   Aspiration pneumonia due to gastric secretions (HCC)   Severe sepsis (HCC)   Acute respiratory distress syndrome (ARDS) due to 2019 novel coronavirus (HCC)   1. Acute on chronic respiratory failure with hypoxia plan is to continue with weaning off the ventilator right now is requiring 1 L oxygen 2. COVID-19 virus infection resolved 3. Aspiration pneumonia treated we will continue to follow 4. ARDS secondary to COVID-19 patient is going to be continue to follow still has some residual deficits 5. Severe sepsis hemodynamics are stable   I have personally seen and evaluated the patient, evaluated laboratory and imaging results, formulated the assessment and plan and placed orders. The Patient requires high complexity decision making with multiple systems involvement.  Rounds were done with the Respiratory Therapy Director and Staff therapists and discussed with nursing staff also.  Yevonne Pax, MD [facility] Pulmonary Critical Care Medicine Sleep Medicine

## 2019-12-20 DIAGNOSIS — U071 COVID-19: Secondary | ICD-10-CM | POA: Diagnosis not present

## 2019-12-20 DIAGNOSIS — J690 Pneumonitis due to inhalation of food and vomit: Secondary | ICD-10-CM | POA: Diagnosis not present

## 2019-12-20 DIAGNOSIS — J9621 Acute and chronic respiratory failure with hypoxia: Secondary | ICD-10-CM | POA: Diagnosis not present

## 2019-12-20 DIAGNOSIS — A419 Sepsis, unspecified organism: Secondary | ICD-10-CM | POA: Diagnosis not present

## 2019-12-20 NOTE — Progress Notes (Signed)
Pulmonary Critical Care Medicine [facility] [place]   PULMONARY CRITICAL CARE SERVICE  PROGRESS NOTE  Date of Service: 12/20/2019  Mason Castillo  [id number]  DOB: 1956/04/14   DOA: 12/13/2019  Referring Physician: Carron Curie, MD  HPI: Mason Castillo is a 64 y.o. male seen for follow up of Acute on Chronic Respiratory Failure.  Patient currently is on the NAG device patient has been on 1 L oxygen the goal is for 12 hours today  Medications: Reviewed on Rounds  Physical Exam:  Vitals: Temperature 97.5 pulse 62 respiratory rate 15 blood pressure is 126/77 saturations 98%  Ventilator Settings off the ventilator right now on NAG device  . General: Comfortable at this time . Eyes: Grossly normal lids, irises & conjunctiva . ENT: grossly tongue is normal . Neck: no obvious mass . Cardiovascular: S1 S2 normal no gallop . Respiratory: No rhonchi no rales noted at this time . Abdomen: soft . Skin: no rash seen on limited exam . Musculoskeletal: not rigid . Psychiatric:unable to assess . Neurologic: no seizure no involuntary movements         Lab Data:   Basic Metabolic Panel: Recent Labs  Lab 12/14/19 0638 12/18/19 0405  NA 145 142  K 3.8 4.4  CL 99 101  CO2 34* 32  GLUCOSE 148* 151*  BUN 32* 27*  CREATININE 0.57* 0.60*  CALCIUM 9.0 8.7*  MG 2.1  --   PHOS 4.0  --     ABG: Recent Labs  Lab 12/14/19 0228  PHART 7.429  PCO2ART 60.3*  PO2ART 121*  HCO3 39.7*  O2SAT 98.7    Liver Function Tests: Recent Labs  Lab 12/14/19 0638  AST 18  ALT 19  ALKPHOS 61  BILITOT 0.6  PROT 6.3*  ALBUMIN 2.2*   No results for input(s): LIPASE, AMYLASE in the last 168 hours. No results for input(s): AMMONIA in the last 168 hours.  CBC: Recent Labs  Lab 12/14/19 0638 12/18/19 0405  WBC 6.5 6.6  NEUTROABS 3.7  --   HGB 8.0* 9.3*  HCT 26.3* 28.9*  MCV 91.6 87.8  PLT 272 234    Cardiac Enzymes: No results for input(s): CKTOTAL, CKMB,  CKMBINDEX, TROPONINI in the last 168 hours.  BNP (last 3 results) No results for input(s): BNP in the last 8760 hours.  ProBNP (last 3 results) No results for input(s): PROBNP in the last 8760 hours.  Radiological Exams: No results found.  Assessment/Plan Active Problems:   Acute on chronic respiratory failure with hypoxia (HCC)   COVID-19 virus infection   Aspiration pneumonia due to gastric secretions (HCC)   Severe sepsis (HCC)   Acute respiratory distress syndrome (ARDS) due to 2019 novel coronavirus (HCC)   1. Acute on chronic respiratory failure hypoxia plan is to continue with the weaning right now the goal is for 12 hours 2. COVID-19 virus infection treated we will continue with supportive care 3. Aspiration pneumonia treated improving slight 4. Severe sepsis patient is at baseline 5. ARDS treated clinically improved we will continue to follow along.   I have personally seen and evaluated the patient, evaluated laboratory and imaging results, formulated the assessment and plan and placed orders. The Patient requires high complexity decision making with multiple systems involvement.  Rounds were done with the Respiratory Therapy Director and Staff therapists and discussed with nursing staff also.  Yevonne Pax, MD [facility] Pulmonary Critical Care Medicine Sleep Medicine

## 2019-12-21 DIAGNOSIS — J690 Pneumonitis due to inhalation of food and vomit: Secondary | ICD-10-CM | POA: Diagnosis not present

## 2019-12-21 DIAGNOSIS — U071 COVID-19: Secondary | ICD-10-CM | POA: Diagnosis not present

## 2019-12-21 DIAGNOSIS — J9621 Acute and chronic respiratory failure with hypoxia: Secondary | ICD-10-CM | POA: Diagnosis not present

## 2019-12-21 DIAGNOSIS — A419 Sepsis, unspecified organism: Secondary | ICD-10-CM | POA: Diagnosis not present

## 2019-12-21 NOTE — Progress Notes (Signed)
Pulmonary Critical Care Medicine [facility] [place]   PULMONARY CRITICAL CARE SERVICE  PROGRESS NOTE  Date of Service: 12/21/2019  Mason Castillo  [id number]  DOB: 1956-12-09   DOA: 12/13/2019  Referring Physician: Carron Curie, MD  HPI: Mason Castillo is a 64 y.o. male seen for follow up of Acute on Chronic Respiratory Failure.  Patient right now is off the ventilator on the NAG device right now is not requiring any oxygen patient's goal is about 20 hours  Medications: Reviewed on Rounds  Physical Exam:  Vitals: Temperature 98.1 pulse 78 respiratory rate 23 blood pressure is 132/66 saturations 98%  Ventilator Settings off the ventilator right now on the NAG  . General: Comfortable at this time . Eyes: Grossly normal lids, irises & conjunctiva . ENT: grossly tongue is normal . Neck: no obvious mass . Cardiovascular: S1 S2 normal no gallop . Respiratory: Scattered rhonchi expansion is equal . Abdomen: soft . Skin: no rash seen on limited exam . Musculoskeletal: not rigid . Psychiatric:unable to assess . Neurologic: no seizure no involuntary movements         Lab Data:   Basic Metabolic Panel: Recent Labs  Lab 12/18/19 0405  NA 142  K 4.4  CL 101  CO2 32  GLUCOSE 151*  BUN 27*  CREATININE 0.60*  CALCIUM 8.7*    ABG: No results for input(s): PHART, PCO2ART, PO2ART, HCO3, O2SAT in the last 168 hours.  Liver Function Tests: No results for input(s): AST, ALT, ALKPHOS, BILITOT, PROT, ALBUMIN in the last 168 hours. No results for input(s): LIPASE, AMYLASE in the last 168 hours. No results for input(s): AMMONIA in the last 168 hours.  CBC: Recent Labs  Lab 12/18/19 0405  WBC 6.6  HGB 9.3*  HCT 28.9*  MCV 87.8  PLT 234    Cardiac Enzymes: No results for input(s): CKTOTAL, CKMB, CKMBINDEX, TROPONINI in the last 168 hours.  BNP (last 3 results) No results for input(s): BNP in the last 8760 hours.  ProBNP (last 3 results) No results for  input(s): PROBNP in the last 8760 hours.  Radiological Exams: No results found.  Assessment/Plan Active Problems:   Acute on chronic respiratory failure with hypoxia (HCC)   COVID-19 virus infection   Aspiration pneumonia due to gastric secretions (HCC)   Severe sepsis (HCC)   Acute respiratory distress syndrome (ARDS) due to 2019 novel coronavirus (HCC)   1. Acute on chronic respiratory failure with hypoxia patient continues to wean doing well the goal is for 20 hours off the ventilator.  If able patient can actually extend to 24 hours 2. COVID-19 virus infection resolved 3. Aspiration pneumonia treated 4. ARDS secondary to COVID-19 treated clinically improved 5. Severe sepsis no change continue with present management   I have personally seen and evaluated the patient, evaluated laboratory and imaging results, formulated the assessment and plan and placed orders. The Patient requires high complexity decision making with multiple systems involvement.  Rounds were done with the Respiratory Therapy Director and Staff therapists and discussed with nursing staff also.  Yevonne Pax, MD [facility] Pulmonary Critical Care Medicine Sleep Medicine

## 2019-12-22 DIAGNOSIS — J9621 Acute and chronic respiratory failure with hypoxia: Secondary | ICD-10-CM | POA: Diagnosis not present

## 2019-12-22 DIAGNOSIS — U071 COVID-19: Secondary | ICD-10-CM | POA: Diagnosis not present

## 2019-12-22 DIAGNOSIS — A419 Sepsis, unspecified organism: Secondary | ICD-10-CM | POA: Diagnosis not present

## 2019-12-22 DIAGNOSIS — J690 Pneumonitis due to inhalation of food and vomit: Secondary | ICD-10-CM | POA: Diagnosis not present

## 2019-12-22 NOTE — Progress Notes (Signed)
Pulmonary Critical Care Medicine [facility] [place]   PULMONARY CRITICAL CARE SERVICE  PROGRESS NOTE  Date of Service: 12/22/2019  Mason Castillo  [id number]  DOB: 03-04-1956   DOA: 12/13/2019  Referring Physician: Carron Curie, MD  HPI: Mason Castillo is a 64 y.o. male seen for follow up of Acute on Chronic Respiratory Failure.  Patient currently is on the NAG device has been on 28% FiO2 the goal today is for 24 hours  Medications: Reviewed on Rounds  Physical Exam:  Vitals: Temperature 97.4 pulse 57 respiratory rate 12 blood pressure is 106/66 saturations 100%  Ventilator Settings off the ventilator right now on 28% FiO2  . General: Comfortable at this time . Eyes: Grossly normal lids, irises & conjunctiva . ENT: grossly tongue is normal . Neck: no obvious mass . Cardiovascular: S1 S2 normal no gallop . Respiratory: No rhonchi coarse breath sounds are noted at this time . Abdomen: soft . Skin: no rash seen on limited exam . Musculoskeletal: not rigid . Psychiatric:unable to assess . Neurologic: no seizure no involuntary movements         Lab Data:   Basic Metabolic Panel: Recent Labs  Lab 12/18/19 0405  NA 142  K 4.4  CL 101  CO2 32  GLUCOSE 151*  BUN 27*  CREATININE 0.60*  CALCIUM 8.7*    ABG: No results for input(s): PHART, PCO2ART, PO2ART, HCO3, O2SAT in the last 168 hours.  Liver Function Tests: No results for input(s): AST, ALT, ALKPHOS, BILITOT, PROT, ALBUMIN in the last 168 hours. No results for input(s): LIPASE, AMYLASE in the last 168 hours. No results for input(s): AMMONIA in the last 168 hours.  CBC: Recent Labs  Lab 12/18/19 0405  WBC 6.6  HGB 9.3*  HCT 28.9*  MCV 87.8  PLT 234    Cardiac Enzymes: No results for input(s): CKTOTAL, CKMB, CKMBINDEX, TROPONINI in the last 168 hours.  BNP (last 3 results) No results for input(s): BNP in the last 8760 hours.  ProBNP (last 3 results) No results for input(s): PROBNP  in the last 8760 hours.  Radiological Exams: No results found.  Assessment/Plan Active Problems:   Acute on chronic respiratory failure with hypoxia (HCC)   COVID-19 virus infection   Aspiration pneumonia due to gastric secretions (HCC)   Severe sepsis (HCC)   Acute respiratory distress syndrome (ARDS) due to 2019 novel coronavirus (HCC)   1. Acute on chronic respiratory failure hypoxia the plan is to continue with the NAG device right now today the goal is 24 hours 2. COVID-19 virus infection at baseline we will continue with supportive care 3. Aspiration pneumonia treated 4. Severe sepsis resolved hemodynamics are stable 5. ARDS treated slow improvement we will continue to monitor radiologically   I have personally seen and evaluated the patient, evaluated laboratory and imaging results, formulated the assessment and plan and placed orders. The Patient requires high complexity decision making with multiple systems involvement.  Rounds were done with the Respiratory Therapy Director and Staff therapists and discussed with nursing staff also.  Yevonne Pax, MD [facility] Pulmonary Critical Care Medicine Sleep Medicine

## 2019-12-23 DIAGNOSIS — U071 COVID-19: Secondary | ICD-10-CM | POA: Diagnosis not present

## 2019-12-23 DIAGNOSIS — J9621 Acute and chronic respiratory failure with hypoxia: Secondary | ICD-10-CM | POA: Diagnosis not present

## 2019-12-23 DIAGNOSIS — J690 Pneumonitis due to inhalation of food and vomit: Secondary | ICD-10-CM | POA: Diagnosis not present

## 2019-12-23 DIAGNOSIS — A419 Sepsis, unspecified organism: Secondary | ICD-10-CM | POA: Diagnosis not present

## 2019-12-23 NOTE — Progress Notes (Signed)
Pulmonary Critical Care Medicine [name]   PULMONARY CRITICAL CARE SERVICE  PROGRESS NOTE  Date of Service: 12/23/2019  Mason Castillo  [id number]  DOB: 1956/06/30   DOA: 12/13/2019  Referring Physician: Carron Curie, MD  HPI: Mason Castillo is a 64 y.o. male seen for follow up of Acute on Chronic Respiratory Failure.  Patient is currently on the NAG device has been on 1 L oxygen and today will be completing 24 hours  Medications: Reviewed on Rounds  Physical Exam:  Vitals: Temperature is 98.2 pulse 57 respiratory rate 20 blood pressure 107/64 saturations 100%  Ventilator Settings off the ventilator on NAG  . General: Comfortable at this time . Eyes: Grossly normal lids, irises & conjunctiva . ENT: grossly tongue is normal . Neck: no obvious mass . Cardiovascular: S1 S2 normal no gallop . Respiratory: No rhonchi no rales are noted at this time . Abdomen: soft . Skin: no rash seen on limited exam . Musculoskeletal: not rigid . Psychiatric:unable to assess . Neurologic: no seizure no involuntary movements         Lab Data:   Basic Metabolic Panel: Recent Labs  Lab 12/18/19 0405  NA 142  K 4.4  CL 101  CO2 32  GLUCOSE 151*  BUN 27*  CREATININE 0.60*  CALCIUM 8.7*    ABG: No results for input(s): PHART, PCO2ART, PO2ART, HCO3, O2SAT in the last 168 hours.  Liver Function Tests: No results for input(s): AST, ALT, ALKPHOS, BILITOT, PROT, ALBUMIN in the last 168 hours. No results for input(s): LIPASE, AMYLASE in the last 168 hours. No results for input(s): AMMONIA in the last 168 hours.  CBC: Recent Labs  Lab 12/18/19 0405  WBC 6.6  HGB 9.3*  HCT 28.9*  MCV 87.8  PLT 234    Cardiac Enzymes: No results for input(s): CKTOTAL, CKMB, CKMBINDEX, TROPONINI in the last 168 hours.  BNP (last 3 results) No results for input(s): BNP in the last 8760 hours.  ProBNP (last 3 results) No results for input(s): PROBNP in the last 8760  hours.  Radiological Exams: No results found.  Assessment/Plan Active Problems:   Acute on chronic respiratory failure with hypoxia (HCC)   COVID-19 virus infection   Aspiration pneumonia due to gastric secretions (HCC)   Severe sepsis (HCC)   Acute respiratory distress syndrome (ARDS) due to 2019 novel coronavirus (HCC)   1. Acute on chronic respiratory failure with hypoxia patient will be continued on weaning more than 24 hours today 2. COVID-19 virus infection resolved 3. Aspiration pneumonia treated 4. Severe sepsis hemodynamics are stable 5. ARDS patient is at baseline we will continue present management   I have personally seen and evaluated the patient, evaluated laboratory and imaging results, formulated the assessment and plan and placed orders. The Patient requires high complexity decision making with multiple systems involvement.  Rounds were done with the Respiratory Therapy Director and Staff therapists and discussed with nursing staff also.  Yevonne Pax, MD [facility] Pulmonary Critical Care Medicine Sleep Medicine

## 2019-12-24 DIAGNOSIS — U071 COVID-19: Secondary | ICD-10-CM | POA: Diagnosis not present

## 2019-12-24 DIAGNOSIS — J690 Pneumonitis due to inhalation of food and vomit: Secondary | ICD-10-CM | POA: Diagnosis not present

## 2019-12-24 DIAGNOSIS — A419 Sepsis, unspecified organism: Secondary | ICD-10-CM | POA: Diagnosis not present

## 2019-12-24 DIAGNOSIS — J9621 Acute and chronic respiratory failure with hypoxia: Secondary | ICD-10-CM | POA: Diagnosis not present

## 2019-12-24 NOTE — Progress Notes (Signed)
Pulmonary Critical Care Medicine [facility] [place]   PULMONARY CRITICAL CARE SERVICE  PROGRESS NOTE  Date of Service: 12/24/2019  Mason Castillo  [id number]  DOB: September 19, 1956   DOA: 12/13/2019  Referring Physician: Carron Curie, MD  HPI: Mason Castillo is a 64 y.o. male seen for follow up of Acute on Chronic Respiratory Failure.  Patient is NAG device has been doing well for 48 hours  Medications: Reviewed on Rounds  Physical Exam:  Vitals: Temperature 98.2 pulse 78 respiratory 19 blood pressure is 108/48 saturations 99%  Ventilator Settings no rhonchi no rales are noted at this time  . General: Comfortable at this time . Eyes: Grossly normal lids, irises & conjunctiva . ENT: grossly tongue is normal . Neck: no obvious mass . Cardiovascular: S1 S2 normal no gallop . Respiratory: Coarse breath sounds few rhonchi . Abdomen: soft . Skin: no rash seen on limited exam . Musculoskeletal: not rigid . Psychiatric:unable to assess . Neurologic: no seizure no involuntary movements         Lab Data:   Basic Metabolic Panel: Recent Labs  Lab 12/18/19 0405  NA 142  K 4.4  CL 101  CO2 32  GLUCOSE 151*  BUN 27*  CREATININE 0.60*  CALCIUM 8.7*    ABG: No results for input(s): PHART, PCO2ART, PO2ART, HCO3, O2SAT in the last 168 hours.  Liver Function Tests: No results for input(s): AST, ALT, ALKPHOS, BILITOT, PROT, ALBUMIN in the last 168 hours. No results for input(s): LIPASE, AMYLASE in the last 168 hours. No results for input(s): AMMONIA in the last 168 hours.  CBC: Recent Labs  Lab 12/18/19 0405  WBC 6.6  HGB 9.3*  HCT 28.9*  MCV 87.8  PLT 234    Cardiac Enzymes: No results for input(s): CKTOTAL, CKMB, CKMBINDEX, TROPONINI in the last 168 hours.  BNP (last 3 results) No results for input(s): BNP in the last 8760 hours.  ProBNP (last 3 results) No results for input(s): PROBNP in the last 8760 hours.  Radiological Exams: No results  found.  Assessment/Plan Active Problems:   Acute on chronic respiratory failure with hypoxia (HCC)   COVID-19 virus infection   Aspiration pneumonia due to gastric secretions (HCC)   Severe sepsis (HCC)   Acute respiratory distress syndrome (ARDS) due to 2019 novel coronavirus (HCC)   1. Acute on chronic respiratory failure hypoxia plan is to continue with weaning advance to capping trials 2. COVID-19 virus infection treated resolving 3. Aspiration pneumonia treated clinically improving 4. Severe sepsis hemodynamics are stable 5. ARDS treated we will continue to follow along   I have personally seen and evaluated the patient, evaluated laboratory and imaging results, formulated the assessment and plan and placed orders. The Patient requires high complexity decision making with multiple systems involvement.  Rounds were done with the Respiratory Therapy Director and Staff therapists and discussed with nursing staff also.  Yevonne Pax, MD [facility] Pulmonary Critical Care Medicine Sleep Medicine

## 2019-12-25 ENCOUNTER — Other Ambulatory Visit (HOSPITAL_COMMUNITY): Payer: Medicare Other

## 2019-12-25 DIAGNOSIS — J9621 Acute and chronic respiratory failure with hypoxia: Secondary | ICD-10-CM | POA: Diagnosis not present

## 2019-12-25 DIAGNOSIS — U071 COVID-19: Secondary | ICD-10-CM | POA: Diagnosis not present

## 2019-12-25 DIAGNOSIS — J690 Pneumonitis due to inhalation of food and vomit: Secondary | ICD-10-CM | POA: Diagnosis not present

## 2019-12-25 DIAGNOSIS — A419 Sepsis, unspecified organism: Secondary | ICD-10-CM | POA: Diagnosis not present

## 2019-12-25 LAB — CBC
HCT: 25.9 % — ABNORMAL LOW (ref 39.0–52.0)
Hemoglobin: 8.4 g/dL — ABNORMAL LOW (ref 13.0–17.0)
MCH: 29.3 pg (ref 26.0–34.0)
MCHC: 32.4 g/dL (ref 30.0–36.0)
MCV: 90.2 fL (ref 80.0–100.0)
Platelets: 233 10*3/uL (ref 150–400)
RBC: 2.87 MIL/uL — ABNORMAL LOW (ref 4.22–5.81)
RDW: 19.1 % — ABNORMAL HIGH (ref 11.5–15.5)
WBC: 5.1 10*3/uL (ref 4.0–10.5)
nRBC: 0 % (ref 0.0–0.2)

## 2019-12-25 LAB — BASIC METABOLIC PANEL
Anion gap: 8 (ref 5–15)
BUN: 17 mg/dL (ref 8–23)
CO2: 33 mmol/L — ABNORMAL HIGH (ref 22–32)
Calcium: 8.6 mg/dL — ABNORMAL LOW (ref 8.9–10.3)
Chloride: 97 mmol/L — ABNORMAL LOW (ref 98–111)
Creatinine, Ser: 0.85 mg/dL (ref 0.61–1.24)
GFR calc Af Amer: >60 mL/min (ref >60)
GFR calc non Af Amer: >60 mL/min (ref >60)
Glucose, Bld: 137 mg/dL — ABNORMAL HIGH (ref 70–99)
Potassium: 4.1 mmol/L (ref 3.5–5.1)
Sodium: 138 mmol/L (ref 135–145)

## 2019-12-25 NOTE — Progress Notes (Addendum)
Pulmonary Critical Care Medicine [place]   PULMONARY CRITICAL CARE SERVICE  PROGRESS NOTE  Date of Service: 12/25/2019  Mason Castillo  [id number]  DOB: [birth year]   DOA: 12/13/2019  Referring Physician: Merton Border, MD  HPI: Mason Castillo is a 64 y.o. male seen for follow up of Acute on Chronic Respiratory Failure.  Patient remains capped at this time for total of 24 hours with current goal of 48 hours.  On 1 L nasal cannula satting well.  Medications: Reviewed on Rounds  Physical Exam:  Vitals: Pulse 106 respirations 29 BP 120/63 O2 sat 99% temp 97.7  Ventilator Settings 1 L nasal cannula  . General: Comfortable at this time . Eyes: Grossly normal lids, irises & conjunctiva . ENT: grossly tongue is normal . Neck: no obvious mass . Cardiovascular: S1 S2 normal no gallop . Respiratory: No rales or rhonchi noted . Abdomen: soft . Skin: no rash seen on limited exam . Musculoskeletal: not rigid . Psychiatric:unable to assess . Neurologic: no seizure no involuntary movements         Lab Data:   Basic Metabolic Panel: Recent Labs  Lab 12/25/19 0627  NA 138  K 4.1  CL 97*  CO2 33*  GLUCOSE 137*  BUN 17  CREATININE 0.85  CALCIUM 8.6*    ABG: No results for input(s): PHART, PCO2ART, PO2ART, HCO3, O2SAT in the last 168 hours.  Liver Function Tests: No results for input(s): AST, ALT, ALKPHOS, BILITOT, PROT, ALBUMIN in the last 168 hours. No results for input(s): LIPASE, AMYLASE in the last 168 hours. No results for input(s): AMMONIA in the last 168 hours.  CBC: Recent Labs  Lab 12/25/19 0627  WBC 5.1  HGB 8.4*  HCT 25.9*  MCV 90.2  PLT 233    Cardiac Enzymes: No results for input(s): CKTOTAL, CKMB, CKMBINDEX, TROPONINI in the last 168 hours.  BNP (last 3 results) No results for input(s): BNP in the last 8760 hours.  ProBNP (last 3 results) No results for input(s): PROBNP in the last 8760 hours.  Radiological Exams: DG  CHEST PORT 1 VIEW  Result Date: 12/25/2019 CLINICAL DATA:  Respiratory failure EXAM: PORTABLE CHEST 1 VIEW COMPARISON:  December 14, 2019 FINDINGS: Tracheostomy catheter tip is 3.7 cm above the carina. Central catheter no longer present. No pneumothorax. There is persistent coarse interstitial thickening and patchy airspace opacity in each upper lobe, similar to prior study. There is less atelectatic change in the left upper lobe compared to prior study. Interstitium is also prominent in the lower lung regions, stable. Heart is borderline enlarged with pulmonary vascularity normal. No adenopathy. No bone lesions. IMPRESSION: Tracheostomy catheter as described without pneumothorax. Multifocal interstitial and airspace opacity, likely due to persistence of atypical organism pneumonia. Less atelectasis left upper lobe compared to prior study. No new opacity evident. Stable cardiac silhouette. No adenopathy demonstrable. Electronically Signed   By: Lowella Grip III M.D.   On: 12/25/2019 08:49    Assessment/Plan Active Problems:   Acute on chronic respiratory failure with hypoxia (HCC)   COVID-19 virus infection   Aspiration pneumonia due to gastric secretions (HCC)   Severe sepsis (HCC)   Acute respiratory distress syndrome (ARDS) due to 2019 novel coronavirus ([place])   1. Acute on chronic respiratory failure hypoxia continue with capping trial with a goal of 48 hours at this time.  We will continue aggressive pulmonary toilet and supportive measures as well as oxygen therapy. 2. COVID-19 virus infection treated resolving  3. Aspiration pneumonia treated clinically improving 4. Severe sepsis hemodynamics are stable 5. ARDS treated we will continue to follow along   I have personally seen and evaluated the patient, evaluated laboratory and imaging results, formulated the assessment and plan and placed orders. The Patient requires high complexity decision making with multiple systems involvement.   Rounds were done with the Respiratory Therapy Director and Staff therapists and discussed with nursing staff also.  Yevonne Pax, MD [facility] Pulmonary Critical Care Medicine Sleep Medicine

## 2019-12-26 DIAGNOSIS — A419 Sepsis, unspecified organism: Secondary | ICD-10-CM | POA: Diagnosis not present

## 2019-12-26 DIAGNOSIS — J690 Pneumonitis due to inhalation of food and vomit: Secondary | ICD-10-CM | POA: Diagnosis not present

## 2019-12-26 DIAGNOSIS — U071 COVID-19: Secondary | ICD-10-CM | POA: Diagnosis not present

## 2019-12-26 DIAGNOSIS — J9621 Acute and chronic respiratory failure with hypoxia: Secondary | ICD-10-CM | POA: Diagnosis not present

## 2019-12-26 NOTE — Progress Notes (Signed)
Pulmonary Critical Care Medicine [place]   PULMONARY CRITICAL CARE SERVICE  PROGRESS NOTE  Date of Service: 12/26/2019  RAHMEL NEDVED  ZOX:[id number]  DOB: [birth year]   DOA: 12/13/2019  Referring Physician: Merton Border, MD  HPI: Mason Castillo is a 64 y.o. male seen for follow up of Acute on Chronic Respiratory Failure.  Patient is capping has been doing well for about 48 hours right now is on 1 L oxygen  Medications: Reviewed on Rounds  Physical Exam:  Vitals: Temperature 98.1 pulse 75 respiratory rate 16 blood pressure is 116/69 saturations 97%  Ventilator Settings off the ventilator capping  . General: Comfortable at this time . Eyes: Grossly normal lids, irises & conjunctiva . ENT: grossly tongue is normal . Neck: no obvious mass . Cardiovascular: S1 S2 normal no gallop . Respiratory: No rhonchi no rales are noted at this time . Abdomen: soft . Skin: no rash seen on limited exam . Musculoskeletal: not rigid . Psychiatric:unable to assess . Neurologic: no seizure no involuntary movements         Lab Data:   Basic Metabolic Panel: Recent Labs  Lab 12/25/19 0627  NA 138  K 4.1  CL 97*  CO2 33*  GLUCOSE 137*  BUN 17  CREATININE 0.85  CALCIUM 8.6*    ABG: No results for input(s): PHART, PCO2ART, PO2ART, HCO3, O2SAT in the last 168 hours.  Liver Function Tests: No results for input(s): AST, ALT, ALKPHOS, BILITOT, PROT, ALBUMIN in the last 168 hours. No results for input(s): LIPASE, AMYLASE in the last 168 hours. No results for input(s): AMMONIA in the last 168 hours.  CBC: Recent Labs  Lab 12/25/19 0627  WBC 5.1  HGB 8.4*  HCT 25.9*  MCV 90.2  PLT 233    Cardiac Enzymes: No results for input(s): CKTOTAL, CKMB, CKMBINDEX, TROPONINI in the last 168 hours.  BNP (last 3 results) No results for input(s): BNP in the last 8760 hours.  ProBNP (last 3 results) No results for input(s): PROBNP in the last 8760  hours.  Radiological Exams: DG CHEST PORT 1 VIEW  Result Date: 12/25/2019 CLINICAL DATA:  Respiratory failure EXAM: PORTABLE CHEST 1 VIEW COMPARISON:  December 14, 2019 FINDINGS: Tracheostomy catheter tip is 3.7 cm above the carina. Central catheter no longer present. No pneumothorax. There is persistent coarse interstitial thickening and patchy airspace opacity in each upper lobe, similar to prior study. There is less atelectatic change in the left upper lobe compared to prior study. Interstitium is also prominent in the lower lung regions, stable. Heart is borderline enlarged with pulmonary vascularity normal. No adenopathy. No bone lesions. IMPRESSION: Tracheostomy catheter as described without pneumothorax. Multifocal interstitial and airspace opacity, likely due to persistence of atypical organism pneumonia. Less atelectasis left upper lobe compared to prior study. No new opacity evident. Stable cardiac silhouette. No adenopathy demonstrable. Electronically Signed   By: Lowella Grip III M.D.   On: 12/25/2019 08:49    Assessment/Plan Active Problems:   Acute on chronic respiratory failure with hypoxia (HCC)   COVID-19 virus infection   Aspiration pneumonia due to gastric secretions (HCC)   Severe sepsis (HCC)   Acute respiratory distress syndrome (ARDS) due to 2019 novel coronavirus ([place])   1. Acute on chronic respiratory failure with hypoxia plan is to continue with the capping as tolerated plan on possible decannulation tomorrow 2. COVID-19 virus infection resolved 3. Aspiration pneumonia treated clinically improving 4. ARDS improving 5. Severe sepsis resolved  I have personally seen and evaluated the patient, evaluated laboratory and imaging results, formulated the assessment and plan and placed orders. The Patient requires high complexity decision making with multiple systems involvement.  Rounds were done with the Respiratory Therapy Director and Staff therapists and discussed  with nursing staff also.  Yevonne Pax, MD [facility] Pulmonary Critical Care Medicine Sleep Medicine

## 2019-12-27 DIAGNOSIS — J9621 Acute and chronic respiratory failure with hypoxia: Secondary | ICD-10-CM | POA: Diagnosis not present

## 2019-12-27 DIAGNOSIS — U071 COVID-19: Secondary | ICD-10-CM | POA: Diagnosis not present

## 2019-12-27 DIAGNOSIS — A419 Sepsis, unspecified organism: Secondary | ICD-10-CM | POA: Diagnosis not present

## 2019-12-27 DIAGNOSIS — J690 Pneumonitis due to inhalation of food and vomit: Secondary | ICD-10-CM | POA: Diagnosis not present

## 2019-12-27 NOTE — Progress Notes (Signed)
Pulmonary Critical Care Medicine [facility] [place]   PULMONARY CRITICAL CARE SERVICE  PROGRESS NOTE  Date of Service: 12/27/2019  Mason Castillo  [id number]  DOB: 06/11/1956   DOA: 12/13/2019  Referring Physician: Carron Curie, MD  HPI: Mason Castillo is a 64 y.o. male seen for follow up of Acute on Chronic Respiratory Failure.  Patient currently is capping doing well has been capping now for 72 hours  Medications: Reviewed on Rounds  Physical Exam:  Vitals: Temperature is 97.9 pulse 60 respiratory 22 blood pressure is 135/62 saturations 93%  Ventilator Settings capping off the ventilator  . General: Comfortable at this time . Eyes: Grossly normal lids, irises & conjunctiva . ENT: grossly tongue is normal . Neck: no obvious mass . Cardiovascular: S1 S2 normal no gallop . Respiratory: No rhonchi no rales are noted at this time . Abdomen: soft . Skin: no rash seen on limited exam . Musculoskeletal: not rigid . Psychiatric:unable to assess . Neurologic: no seizure no involuntary movements         Lab Data:   Basic Metabolic Panel: Recent Labs  Lab 12/25/19 0627  NA 138  K 4.1  CL 97*  CO2 33*  GLUCOSE 137*  BUN 17  CREATININE 0.85  CALCIUM 8.6*    ABG: No results for input(s): PHART, PCO2ART, PO2ART, HCO3, O2SAT in the last 168 hours.  Liver Function Tests: No results for input(s): AST, ALT, ALKPHOS, BILITOT, PROT, ALBUMIN in the last 168 hours. No results for input(s): LIPASE, AMYLASE in the last 168 hours. No results for input(s): AMMONIA in the last 168 hours.  CBC: Recent Labs  Lab 12/25/19 0627  WBC 5.1  HGB 8.4*  HCT 25.9*  MCV 90.2  PLT 233    Cardiac Enzymes: No results for input(s): CKTOTAL, CKMB, CKMBINDEX, TROPONINI in the last 168 hours.  BNP (last 3 results) No results for input(s): BNP in the last 8760 hours.  ProBNP (last 3 results) No results for input(s): PROBNP in the last 8760 hours.  Radiological  Exams: No results found.  Assessment/Plan Active Problems:   Acute on chronic respiratory failure with hypoxia (HCC)   COVID-19 virus infection   Aspiration pneumonia due to gastric secretions (HCC)   Severe sepsis (HCC)   Acute respiratory distress syndrome (ARDS) due to 2019 novel coronavirus (HCC)   1. Acute on chronic respiratory failure hypoxia plan is to go ahead and decannulate 2. COVID-19 virus infection treated we will continue with supportive care 3. Aspiration pneumonia clinically is improving we will continue with supportive care 4. Severe sepsis resolved 5. ARDS resolved we will continue present management monitor radiologically   I have personally seen and evaluated the patient, evaluated laboratory and imaging results, formulated the assessment and plan and placed orders. The Patient requires high complexity decision making with multiple systems involvement.  Rounds were done with the Respiratory Therapy Director and Staff therapists and discussed with nursing staff also.  Yevonne Pax, MD [facility] Pulmonary Critical Care Medicine Sleep Medicine

## 2019-12-28 ENCOUNTER — Other Ambulatory Visit (HOSPITAL_COMMUNITY): Payer: Medicare Other

## 2019-12-28 DIAGNOSIS — A419 Sepsis, unspecified organism: Secondary | ICD-10-CM | POA: Diagnosis not present

## 2019-12-28 DIAGNOSIS — J690 Pneumonitis due to inhalation of food and vomit: Secondary | ICD-10-CM | POA: Diagnosis not present

## 2019-12-28 DIAGNOSIS — J9621 Acute and chronic respiratory failure with hypoxia: Secondary | ICD-10-CM | POA: Diagnosis not present

## 2019-12-28 DIAGNOSIS — U071 COVID-19: Secondary | ICD-10-CM | POA: Diagnosis not present

## 2019-12-28 NOTE — Progress Notes (Signed)
Pulmonary Critical Care Medicine [facility] [place]   PULMONARY CRITICAL CARE SERVICE  PROGRESS NOTE  Date of Service: 12/28/2019  Mason Castillo  WVP:[id number]  DOB: Mar 09, 1956   DOA: 12/13/2019  Referring Physician: Carron Curie, MD  HPI: Mason Castillo is a 64 y.o. male seen for follow up of Acute on Chronic Respiratory Failure.  Patient successfully decannulated looks good.  Has got good saturations no issues post decannulation  Medications: Reviewed on Rounds  Physical Exam:  Vitals: Temperature 98.8 pulse 75 respiratory 18 blood pressure is 127/71 saturations 96%  Ventilator Settings decannulated  . General: Comfortable at this time . Eyes: Grossly normal lids, irises & conjunctiva . ENT: grossly tongue is normal . Neck: no obvious mass . Cardiovascular: S1 S2 normal no gallop . Respiratory: No rhonchi no rales are noted at this time . Abdomen: soft . Skin: no rash seen on limited exam . Musculoskeletal: not rigid . Psychiatric:unable to assess . Neurologic: no seizure no involuntary movements         Lab Data:   Basic Metabolic Panel: Recent Labs  Lab 12/25/19 0627  NA 138  K 4.1  CL 97*  CO2 33*  GLUCOSE 137*  BUN 17  CREATININE 0.85  CALCIUM 8.6*    ABG: No results for input(s): PHART, PCO2ART, PO2ART, HCO3, O2SAT in the last 168 hours.  Liver Function Tests: No results for input(s): AST, ALT, ALKPHOS, BILITOT, PROT, ALBUMIN in the last 168 hours. No results for input(s): LIPASE, AMYLASE in the last 168 hours. No results for input(s): AMMONIA in the last 168 hours.  CBC: Recent Labs  Lab 12/25/19 0627  WBC 5.1  HGB 8.4*  HCT 25.9*  MCV 90.2  PLT 233    Cardiac Enzymes: No results for input(s): CKTOTAL, CKMB, CKMBINDEX, TROPONINI in the last 168 hours.  BNP (last 3 results) No results for input(s): BNP in the last 8760 hours.  ProBNP (last 3 results) No results for input(s): PROBNP in the last 8760  hours.  Radiological Exams: No results found.  Assessment/Plan Active Problems:   Acute on chronic respiratory failure with hypoxia (HCC)   COVID-19 virus infection   Aspiration pneumonia due to gastric secretions (HCC)   Severe sepsis (HCC)   Acute respiratory distress syndrome (ARDS) due to 2019 novel coronavirus (HCC)   1. Acute on chronic respiratory failure with hypoxia plan is to continue with supportive care oxygen as necessary continue pulmonary toilet 2. COVID-19 virus infection resolved 3. Aspiration pneumonia treated resolved to have an assessment of his swallowing by speech 4. Severe sepsis resolved 5. ARDS treated resolved   I have personally seen and evaluated the patient, evaluated laboratory and imaging results, formulated the assessment and plan and placed orders. The Patient requires high complexity decision making with multiple systems involvement.  Rounds were done with the Respiratory Therapy Director and Staff therapists and discussed with nursing staff also.  Yevonne Pax, MD [facility] Pulmonary Critical Care Medicine Sleep Medicine

## 2019-12-29 LAB — NOVEL CORONAVIRUS, NAA (HOSP ORDER, SEND-OUT TO REF LAB; TAT 18-24 HRS): SARS-CoV-2, NAA: NOT DETECTED

## 2019-12-30 LAB — NOVEL CORONAVIRUS, NAA (HOSP ORDER, SEND-OUT TO REF LAB; TAT 18-24 HRS): SARS-CoV-2, NAA: NOT DETECTED

## 2021-01-19 IMAGING — DX DG ABDOMEN 1V
1 series · 1 of 1 positions shown · non-contrast
Comparison: None.

CLINICAL DATA: Peg tube placement

EXAM:
ABDOMEN - 1 VIEW

[abdomen kub]
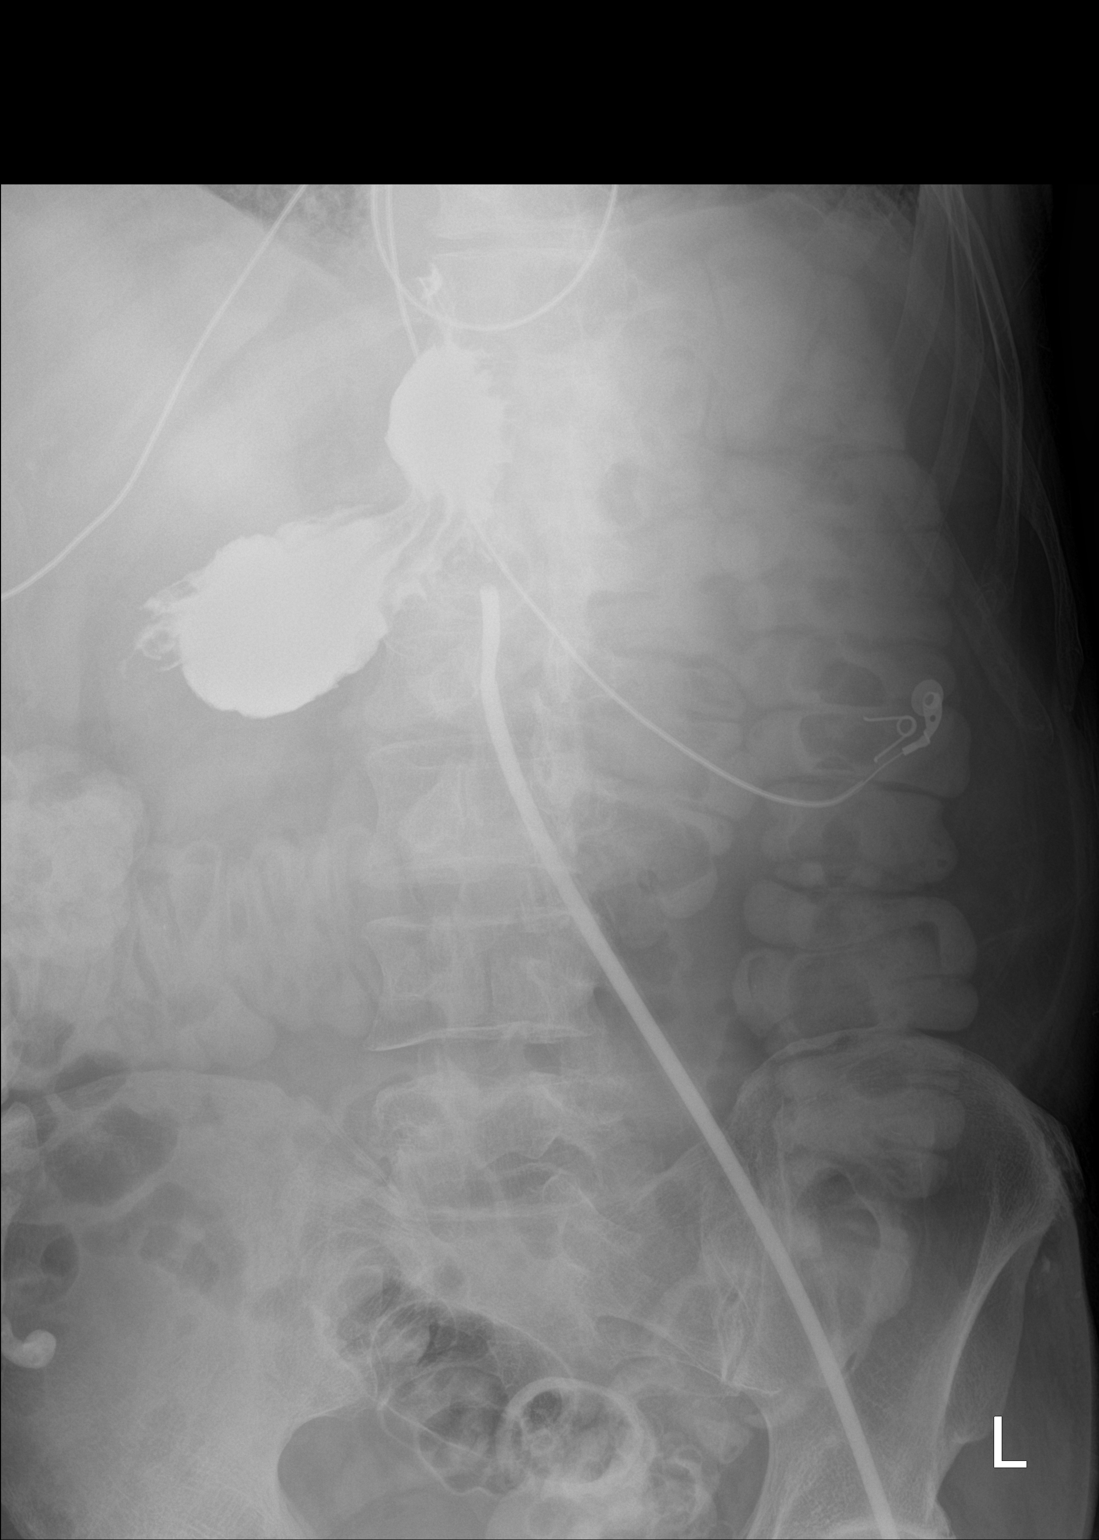

[1 of 1 positions shown; findings below may reference images not displayed]

FINDINGS: Injection of contrast through the pre-existing PEG tube opacifies
the stomach. There is no definite pneumoperitoneum. Oral contrast is
noted throughout the colon.
IMPRESSION: Injection of contrast through the pre-existing PEG tube opacifies
the stomach.

## 2021-01-20 IMAGING — DX DG CHEST 1V PORT
1 series · 1 of 1 positions shown · non-contrast
Comparison: 07/20/2015

CLINICAL DATA: Tracheostomy tube

EXAM:
PORTABLE CHEST 1 VIEW

[chest]
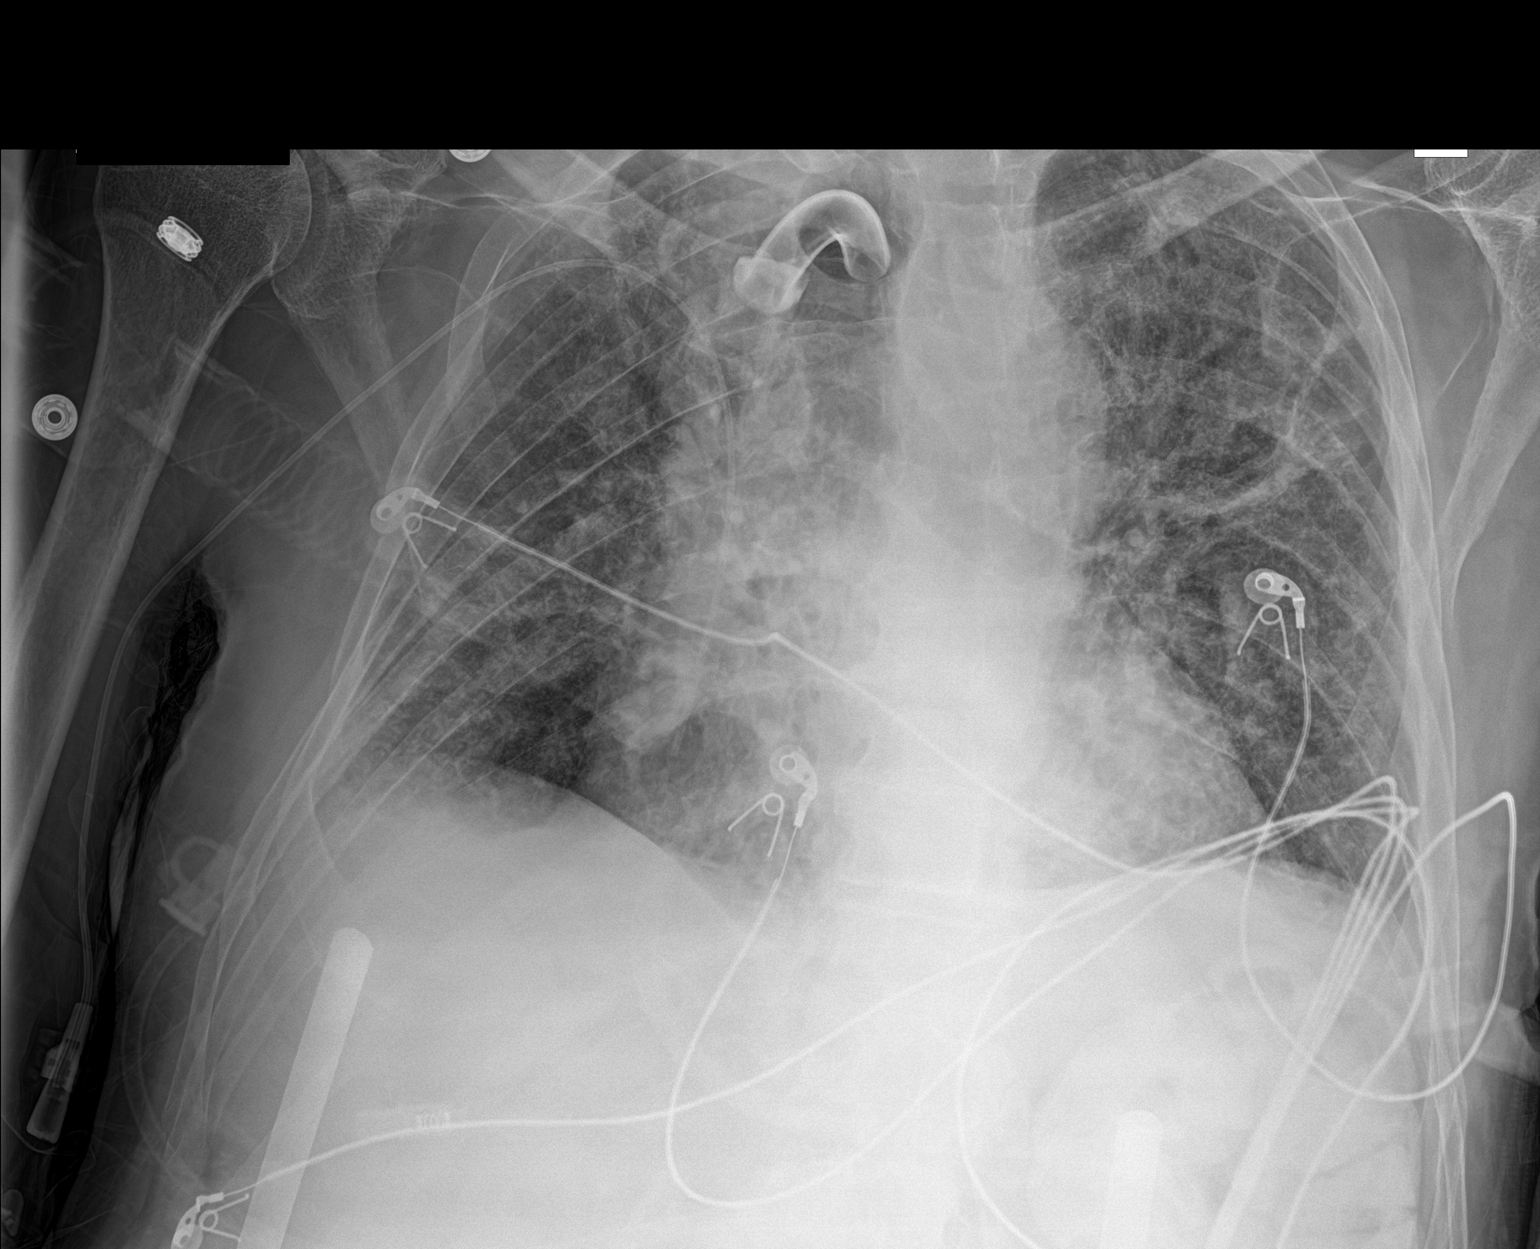

[1 of 1 positions shown; findings below may reference images not displayed]

FINDINGS: Tracheostomy tube is seated with cuff mildly distending the trachea.
There is a right upper extremity PICC with tip at the upper right
atrium.

Coarse interstitial opacities which may be related to history of
COVID. Lung volumes are low. No definite edema and no visible
pneumothorax or pleural effusion. Cardiomegaly.
IMPRESSION: 1. Low volume chest with nonspecific interstitial coarsening.
2. Hardware as described.

## 2021-01-31 IMAGING — DX DG CHEST 1V PORT
1 series · 1 of 1 positions shown · non-contrast
Comparison: December 14, 2019

CLINICAL DATA: Respiratory failure

EXAM:
PORTABLE CHEST 1 VIEW

[chest ap]
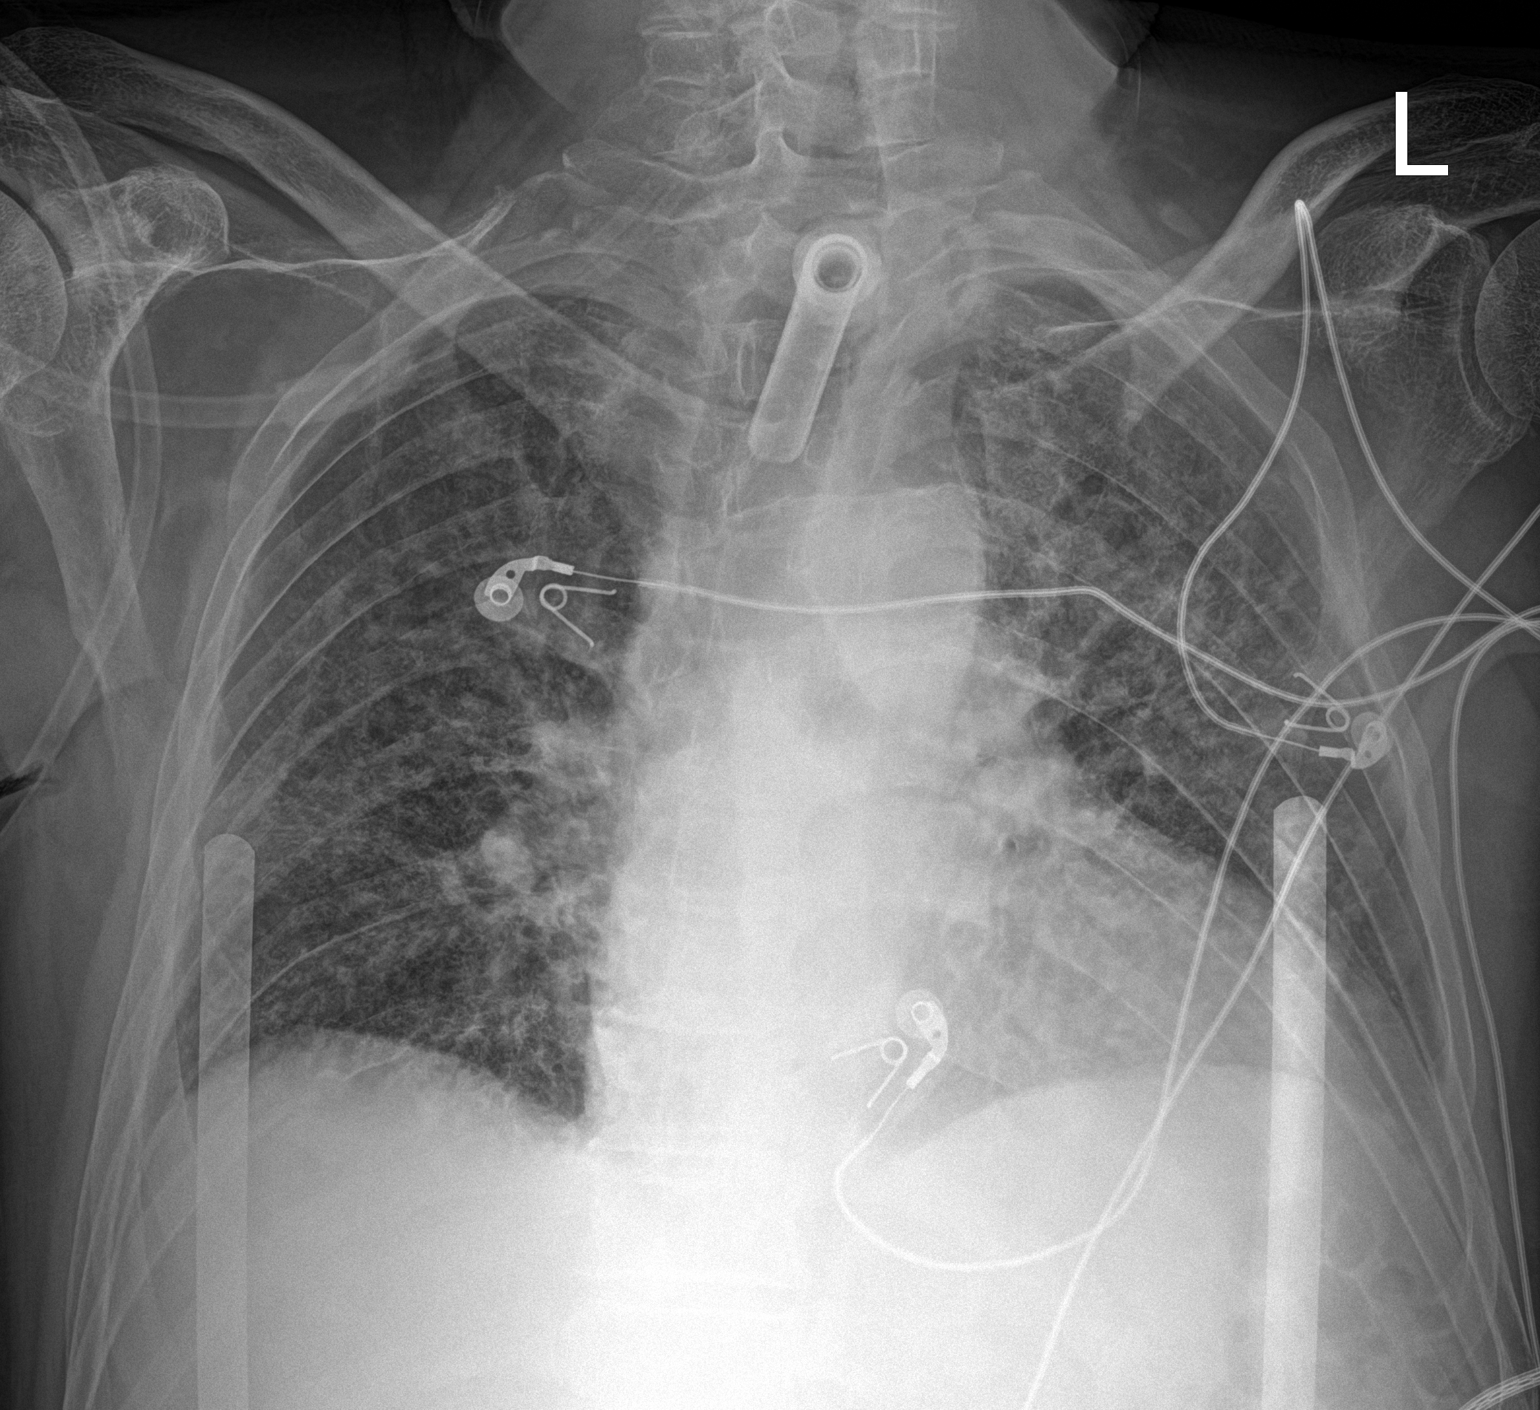

[1 of 1 positions shown; findings below may reference images not displayed]

FINDINGS: Tracheostomy catheter tip is 3.7 cm above the carina. Central
catheter no longer present. No pneumothorax. There is persistent
coarse interstitial thickening and patchy airspace opacity in each
upper lobe, similar to prior study. There is less atelectatic change
in the left upper lobe compared to prior study. Interstitium is also
prominent in the lower lung regions, stable.

Heart is borderline enlarged with pulmonary vascularity normal. No
adenopathy. No bone lesions.
IMPRESSION: Tracheostomy catheter as described without pneumothorax. Multifocal
interstitial and airspace opacity, likely due to persistence of
atypical organism pneumonia. Less atelectasis left upper lobe
compared to prior study. No new opacity evident. Stable cardiac
silhouette. No adenopathy demonstrable.
